# Patient Record
Sex: Female | Born: 1995
Health system: Southern US, Community
[De-identification: ages and names within clinical notes are randomized; demographics above are authoritative.]

## PROBLEM LIST (undated history)

## (undated) DIAGNOSIS — O24419 Gestational diabetes mellitus in pregnancy, unspecified control: Secondary | ICD-10-CM

## (undated) DIAGNOSIS — R0789 Other chest pain: Secondary | ICD-10-CM

## (undated) DIAGNOSIS — E119 Type 2 diabetes mellitus without complications: Secondary | ICD-10-CM

## (undated) DIAGNOSIS — J302 Other seasonal allergic rhinitis: Secondary | ICD-10-CM

## (undated) HISTORY — DX: Other chest pain: R07.89

## (undated) HISTORY — PX: WISDOM TOOTH EXTRACTION: SHX21

## (undated) HISTORY — DX: Other seasonal allergic rhinitis: J30.2

## (undated) HISTORY — PX: OTHER SURGICAL HISTORY: SHX169

---
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
## 2013-03-28 ENCOUNTER — Encounter (HOSPITAL_COMMUNITY): Payer: Self-pay | Admitting: Emergency Medicine

## 2013-03-28 ENCOUNTER — Emergency Department (HOSPITAL_COMMUNITY)
Admission: EM | Admit: 2013-03-28 | Discharge: 2013-03-28 | Disposition: A | Discharge status: Home / Self Care | Payer: Self-pay | Attending: Emergency Medicine | Admitting: Emergency Medicine

## 2013-03-28 DIAGNOSIS — J029 Acute pharyngitis, unspecified: Secondary | ICD-10-CM | POA: Insufficient documentation

## 2013-03-28 DIAGNOSIS — R42 Dizziness and giddiness: Secondary | ICD-10-CM | POA: Insufficient documentation

## 2013-03-28 DIAGNOSIS — Y939 Activity, unspecified: Secondary | ICD-10-CM | POA: Insufficient documentation

## 2013-03-28 DIAGNOSIS — Z23 Encounter for immunization: Secondary | ICD-10-CM | POA: Insufficient documentation

## 2013-03-28 DIAGNOSIS — IMO0002 Reserved for concepts with insufficient information to code with codable children: Secondary | ICD-10-CM | POA: Insufficient documentation

## 2013-03-28 DIAGNOSIS — Y990 Civilian activity done for income or pay: Secondary | ICD-10-CM | POA: Insufficient documentation

## 2013-03-28 DIAGNOSIS — Z77098 Contact with and (suspected) exposure to other hazardous, chiefly nonmedicinal, chemicals: Secondary | ICD-10-CM | POA: Insufficient documentation

## 2013-03-28 DIAGNOSIS — Y9289 Other specified places as the place of occurrence of the external cause: Secondary | ICD-10-CM | POA: Insufficient documentation

## 2013-03-28 DIAGNOSIS — T23229A Burn of second degree of unspecified single finger (nail) except thumb, initial encounter: Secondary | ICD-10-CM | POA: Insufficient documentation

## 2013-03-28 DIAGNOSIS — R238 Other skin changes: Secondary | ICD-10-CM | POA: Insufficient documentation

## 2013-03-28 MED ORDER — TETANUS-DIPHTH-ACELL PERTUSSIS 5-2.5-18.5 LF-MCG/0.5 IM SUSP
0.5000 mL | Freq: Once | INTRAMUSCULAR | Status: AC
  Administered 2013-03-28: 0.5 mL via INTRAMUSCULAR
  Filled 2013-03-28: qty 0.5

## 2013-03-28 NOTE — ED Provider Notes (Signed)
CSN: [id number]     Arrival date & time 03/28/13  1455 History  This chart was scribed for Coral Ceo, PA, working with Flint Melter, MD by Dorothey Baseman, ED Scribe. This patient was seen in room TR04C/TR04C and the patient's care was started at 4:14 PM.    Chief Complaint  Patient presents with  . reaction to chemical    The history is provided by the patient. No language interpreter was used.    HPI Comments: Megan Shannon is a 17 y.o. female who presents to the ED for a chemic reaction.  Around 12:30 at work today a [employer] trap spilled on her hands.  She complains of a burning sensation to her hands bilaterally.  She also has a blister to the right 3rd finger.  She states that she washed her hands multiple times with soap and water after the accident. She may have inhaled some of the fumes and states she felt her throat closing and coughing, which has improved since onset and still mildly present.  No solution splashed into her eyes.  Patient also reports some mild associated dizziness, which has also resolved. She denies nausea, vomiting, and abdominal pain. She denies any knowledge of allergies, allergic reactions in the past, or any other pertinent medical history.    History reviewed. No pertinent past medical history. History reviewed. No pertinent past surgical history. No family history on file. History  Substance Use Topics  . Smoking status: Never Smoker   . Smokeless tobacco: Not on file  . Alcohol Use: No   OB History   Grav Para Term Preterm Abortions TAB SAB Ect Mult Living                 Review of Systems  Constitutional: Negative for chills, activity change, appetite change and fatigue.  HENT: Positive for sore throat. Negative for drooling, facial swelling, rhinorrhea, trouble swallowing and voice change.   Eyes: Negative for pain, itching and visual disturbance.  Respiratory: Negative for cough, choking, chest tightness, shortness of  breath, wheezing and stridor.   Cardiovascular: Negative for chest pain and palpitations.  Gastrointestinal: Negative for nausea, vomiting and abdominal pain.  Musculoskeletal: Negative for arthralgias and joint swelling.  Skin: Positive for color change. Negative for wound.  Neurological: Positive for dizziness. Negative for weakness and headaches.  All other systems reviewed and are negative.    Allergies  Review of patient's allergies indicates not on file.  Home Medications  No current outpatient prescriptions on file.  Triage Vitals: BP 124/80  Pulse 86  Temp(Src) 99.2 F (37.3 C)  Resp 16  SpO2 100%  Filed Vitals:   03/28/13 1523  BP: 124/80  Pulse: 86  Temp: 99.2 F (37.3 C)  Resp: 16  SpO2: 100%     Physical Exam  Nursing note and vitals reviewed. Constitutional: She is oriented to person, place, and time. She appears well-developed and well-nourished. No distress.  HENT:  Head: Normocephalic and atraumatic.  Right Ear: External ear normal.  Left Ear: External ear normal.  Nose: Nose normal.  Mouth/Throat: Oropharynx is clear and moist. No oropharyngeal exudate.  Uvula midline. No perioral or periorbital edema bilaterally.   Eyes: Conjunctivae and EOM are normal. Pupils are equal, round, and reactive to light.  Neck: Normal range of motion. Neck supple. No tracheal deviation present.  No palpable masses or edema to the neck throughout  Cardiovascular: Normal rate, regular rhythm, normal heart sounds and intact distal pulses.  Exam reveals no gallop and no friction rub.   No murmur heard. Radial pulses present bilaterally  Pulmonary/Chest: Effort normal and breath sounds normal. No respiratory distress. She has no wheezes. She has no rales. She exhibits no tenderness.  Abdominal: Soft. Bowel sounds are normal. She exhibits no distension and no mass. There is no tenderness. There is no rebound and no guarding.  Musculoskeletal: Normal range of motion. She  exhibits tenderness. She exhibits no edema.  Grip strength 5/5 bilaterally. No limitations with ROM of the right hand.   Lymphadenopathy:    She has no cervical adenopathy.  Neurological: She is alert and oriented to person, place, and time.  Skin: Skin is warm and dry. She is not diaphoretic.  Blister present on the medial distal 3rd right finger with no open wounds or surrounding erythema.  No erythema to her hands throughout.    Psychiatric: She has a normal mood and affect. Her behavior is normal.    ED Course  Procedures (including critical care time)  DIAGNOSTIC STUDIES: Oxygen Saturation is 100% on room air, normal by my interpretation.    COORDINATION OF CARE: 4:18 PM - Will consult with the attending physician about potential treatment course. Patient verbalizes understanding and agrees with treatment plan.  4:54 PM- Consulted with Dr. Effie Shy and decided that poison control does not need to be contacted. Discussed that the ingredients in the chemical do not pose any serious threat to the patient's health (acetic acid). Advised her to keep the affected areas clean and to follow up if there are any new or worsening symptoms. Will apply a topical antibiotic and a band-aid. Will order a tetanus vaccination. Discussed treatment plan with patient at bedside and patient verbalized agreement.    Labs Review Labs Reviewed - No data to display Imaging Review No results found.  EKG Interpretation   None       MDM   1. Chemical exposure     Megan Shannon is a 17 y.o. female who presents to the ED for a chemic reaction.  Tetanus booster given.    Patient was evaluated in the ED after a chemical spill.  She had no difficulty swallowing or breathing. She has a small blister to her hand with no open wounds.  Patient non-toxic and remained in no acute distress.  Patient was instructed to return to the ED if they experience any difficulty breathing/swallowing or other  concerns.  Patient was in agreement with discharge and plan.     Final impressions: 1. Chemical exposure     Luiz Iron PA-C   This patient was discussed with Dr. Effie Shy   I personally performed the services described in this documentation, which was scribed in my presence. The recorded information has been reviewed and is accurate.    Jillyn Ledger, PA-C 03/30/13 [phone]

## 2013-03-28 NOTE — ED Notes (Signed)
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  She sees redness and she feels  Sob not sob detected.  No rash

## 2013-03-28 NOTE — ED Notes (Signed)
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  Rash noted to right hand since incident.  Pt states "it feels like my throat is closing".  Pt speaking in full sentences- no respiratory distress noted.  Respirations equal and unlabored.

## 2013-04-02 NOTE — ED Provider Notes (Signed)
Medical screening examination/treatment/procedure(s) were performed by non-physician practitioner and as supervising physician I was immediately available for consultation/collaboration.  Flint Melter, MD 04/02/13 [phone]

## 2013-12-29 ENCOUNTER — Emergency Department (HOSPITAL_COMMUNITY)
Admission: EM | Admit: 2013-12-29 | Discharge: 2013-12-29 | Disposition: A | Discharge status: Home / Self Care | Payer: BC Managed Care – PPO | Source: Home / Self Care | Attending: Emergency Medicine | Admitting: Emergency Medicine

## 2013-12-29 ENCOUNTER — Encounter (HOSPITAL_COMMUNITY): Payer: Self-pay | Admitting: Emergency Medicine

## 2013-12-29 DIAGNOSIS — N390 Urinary tract infection, site not specified: Secondary | ICD-10-CM

## 2013-12-29 LAB — POCT URINALYSIS DIP (DEVICE)
Glucose, UA: 100 mg/dL — AB
LEUKOCYTES UA: NEGATIVE
Nitrite: POSITIVE — AB
PROTEIN: 100 mg/dL — AB
Specific Gravity, Urine: 1.025 (ref 1.005–1.030)
UROBILINOGEN UA: 0.2 mg/dL (ref 0.0–1.0)
pH: 5.5 (ref 5.0–8.0)

## 2013-12-29 LAB — POCT PREGNANCY, URINE: PREG TEST UR: NEGATIVE

## 2013-12-29 MED ORDER — ONDANSETRON 4 MG PO TBDP
4.0000 mg | ORAL_TABLET | Freq: Once | ORAL | Status: AC
  Administered 2013-12-29: 4 mg via ORAL

## 2013-12-29 MED ORDER — ONDANSETRON 4 MG PO TBDP
ORAL_TABLET | ORAL | Status: AC
  Filled 2013-12-29: qty 1

## 2013-12-29 MED ORDER — ONDANSETRON HCL 4 MG PO TABS: 4.0000 mg | ORAL_TABLET | Freq: Three times a day (TID) | ORAL | Status: DC | PRN

## 2013-12-29 MED ORDER — ACETAMINOPHEN 325 MG PO TABS
ORAL_TABLET | ORAL | Status: AC
  Filled 2013-12-29: qty 2

## 2013-12-29 MED ORDER — NITROFURANTOIN MONOHYD MACRO 100 MG PO CAPS: 100.0000 mg | ORAL_CAPSULE | Freq: Two times a day (BID) | ORAL | Status: DC

## 2013-12-29 MED ORDER — ACETAMINOPHEN 325 MG PO TABS
650.0000 mg | ORAL_TABLET | Freq: Once | ORAL | Status: AC
  Administered 2013-12-29: 650 mg via ORAL

## 2013-12-29 NOTE — ED Notes (Signed)
Pt  Has  Multiple symptoms  To  Include   Vomiting     Stomach  Pain  Headache     Fatigue      With  Symptoms  Starting  Yesterday       Pt is  Sitting  Upright on  Exam table   Speaking in  Complete  sentances  No  Active  Vomiting

## 2013-12-29 NOTE — Discharge Instructions (Signed)
Your test for strep throat was negative. Your pregnancy test was negative. Your urine studies showed a urinary tract infection. Please begin taking Macrobid as prescribed for this infection and use Zofran as directed for nausea. If symptoms do not improve over the next 1-2 days, please follow up with your doctor.   Urinary Tract Infection Urinary tract infections (UTIs) can develop anywhere along your urinary tract. Your urinary tract is your body's drainage system for removing wastes and extra water. Your urinary tract includes two kidneys, two ureters, a bladder, and a urethra. Your kidneys are a pair of bean-shaped organs. Each kidney is about the size of your fist. They are located below your ribs, one on each side of your spine. CAUSES Infections are caused by microbes, which are microscopic organisms, including fungi, viruses, and bacteria. These organisms are so small that they can only be seen through a microscope. Bacteria are the microbes that most commonly cause UTIs. SYMPTOMS  Symptoms of UTIs may vary by age and gender of the patient and by the location of the infection. Symptoms in young women typically include a frequent and intense urge to urinate and a painful, burning feeling in the bladder or urethra during urination. Older women and men are more likely to be tired, shaky, and weak and have muscle aches and abdominal pain. A fever may mean the infection is in your kidneys. Other symptoms of a kidney infection include pain in your back or sides below the ribs, nausea, and vomiting. DIAGNOSIS To diagnose a UTI, your caregiver will ask you about your symptoms. Your caregiver also will ask to provide a urine sample. The urine sample will be tested for bacteria and white blood cells. White blood cells are made by your body to help fight infection. TREATMENT  Typically, UTIs can be treated with medication. Because most UTIs are caused by a bacterial infection, they usually can be treated with  the use of antibiotics. The choice of antibiotic and length of treatment depend on your symptoms and the type of bacteria causing your infection. HOME CARE INSTRUCTIONS  If you were prescribed antibiotics, take them exactly as your caregiver instructs you. Finish the medication even if you feel better after you have only taken some of the medication.  Drink enough water and fluids to keep your urine clear or pale yellow.  Avoid caffeine, tea, and carbonated beverages. They tend to irritate your bladder.  Empty your bladder often. Avoid holding urine for long periods of time.  Empty your bladder before and after sexual intercourse.  After a bowel movement, women should cleanse from front to back. Use each tissue only once. SEEK MEDICAL CARE IF:   You have back pain.  You develop a fever.  Your symptoms do not begin to resolve within 3 days. SEEK IMMEDIATE MEDICAL CARE IF:   You have severe back pain or lower abdominal pain.  You develop chills.  You have nausea or vomiting.  You have continued burning or discomfort with urination. MAKE SURE YOU:   Understand these instructions.  Will watch your condition.  Will get help right away if you are not doing well or get worse. Document Released: 03/10/2005 Document Revised: 11/30/2011 Document Reviewed: 07/09/2011 [facility] Patient Information 2015 [place], [place]. This information is not intended to replace advice given to you by your health care provider. Make sure you discuss any questions you have with your health care provider.

## 2013-12-29 NOTE — ED Provider Notes (Signed)
CSN: [id number]     Arrival date & time 12/29/13  [id number] History   First MD Initiated Contact with Patient 12/29/13 [phone]     Chief Complaint  Patient presents with  . Emesis   (Consider location/radiation/quality/duration/timing/severity/associated sxs/prior Treatment) HPI Comments: Patient states she began feeling unwell yesterday evening. Developed sore throat, nausea, vomiting without diarrhea or fever.  Works at [employer] No known ill contacts No fever Otherwise healthy [facility]   Patient is a 18 y.o. female presenting with vomiting. The history is provided by the patient.  Emesis Severity:  Moderate Duration:  12 hours Timing:  Intermittent Quality:  Stomach contents Associated symptoms: sore throat   Associated symptoms: no diarrhea     History reviewed. No pertinent past medical history. History reviewed. No pertinent past surgical history. History reviewed. No pertinent family history. History  Substance Use Topics  . Smoking status: Never Smoker   . Smokeless tobacco: Not on file  . Alcohol Use: No   OB History   Grav Para Term Preterm Abortions TAB SAB Ect Mult Living                 Review of Systems  Constitutional: Positive for appetite change.  HENT: Positive for sore throat. Negative for congestion, dental problem, ear pain, mouth sores, postnasal drip, rhinorrhea, trouble swallowing and voice change.   Eyes: Negative.   Respiratory: Negative.   Cardiovascular: Negative.   Gastrointestinal: Positive for nausea and vomiting. Negative for diarrhea.  Endocrine: Negative for polydipsia, polyphagia and polyuria.  Genitourinary: Negative.        Reports that her urine this morning was dark  Musculoskeletal: Negative.   Skin: Negative.   Allergic/Immunologic: Negative for immunocompromised state.  Neurological: Negative.     Allergies  Review of patient's allergies indicates no known allergies.  Home Medications   Prior to  Admission medications   Medication Sig Start Date End Date Taking? Authorizing Provider  levonorgestrel-ethinyl estradiol (AVIANE,ALESSE,LESSINA) 0.1-20 MG-MCG tablet Take 1 tablet by mouth daily.    Historical Provider, MD  nitrofurantoin, macrocrystal-monohydrate, (MACROBID) 100 MG capsule Take 1 capsule (100 mg total) by mouth 2 (two) times daily. X 7 days 12/29/13   Jess BartersJennifer Lee Presson, PA  ondansetron (ZOFRAN) 4 MG tablet Take 1 tablet (4 mg total) by mouth every 8 (eight) hours as needed for nausea or vomiting. 12/29/13   Ardis RowanJennifer Lee Presson, PA  PRESCRIPTION MEDICATION Take 1 tablet by mouth at bedtime. Acne medication.    Historical Provider, MD  PRESCRIPTION MEDICATION Apply 1 application topically at bedtime. Acne medication (gel form)  Apply to face only.    Historical Provider, MD   BP 129/78  Pulse 87  Temp(Src) 98.1 F (36.7 C) (Oral)  Resp 20  SpO2 98%  LMP 12/16/2013 Physical Exam  Nursing note and vitals reviewed. Constitutional: She is oriented to person, place, and time. She appears well-developed and well-nourished. No distress.  HENT:  Head: Normocephalic and atraumatic.  Right Ear: Hearing, tympanic membrane, external ear and ear canal normal.  Left Ear: Hearing, tympanic membrane, external ear and ear canal normal.  Nose: Nose normal.  Mouth/Throat: Uvula is midline and mucous membranes are normal. No oral lesions. No trismus in the jaw. No uvula swelling or dental caries. Posterior oropharyngeal erythema present. No oropharyngeal exudate, posterior oropharyngeal edema or tonsillar abscesses.  Eyes: Conjunctivae are normal. No scleral icterus.  Neck: Normal range of motion. Neck supple.  Cardiovascular: Normal rate, regular rhythm and normal  heart sounds.   Pulmonary/Chest: Effort normal and breath sounds normal. No respiratory distress. She has no wheezes.  Abdominal: Soft. Normal appearance and bowel sounds are normal. She exhibits no distension and no mass. There  is no hepatosplenomegaly. There is tenderness in the periumbilical area. There is no rigidity, no rebound, no guarding, no CVA tenderness, no tenderness at McBurney's point and negative Murphy's sign.  +mild periumbilical discomfort  Musculoskeletal: Normal range of motion.  Lymphadenopathy:    She has no cervical adenopathy.  Neurological: She is alert and oriented to person, place, and time.  Skin: Skin is warm and dry. No rash noted. No erythema.  Psychiatric: She has a normal mood and affect. Her behavior is normal.    ED Course  Procedures (including critical care time) Labs Review Labs Reviewed  POCT URINALYSIS DIP (DEVICE) - Abnormal; Notable for the following:    Glucose, UA 100 (*)    Bilirubin Urine SMALL (*)    Ketones, ur TRACE (*)    Hgb urine dipstick TRACE (*)    Protein, ur 100 (*)    Nitrite POSITIVE (*)    All other components within normal limits  URINE CULTURE  POCT PREGNANCY, URINE    Imaging Review No results found.   MDM   1. UTI (lower urinary tract infection)    UA consistent with UTI. UPT negative. Rapid strep negative. Given 4mg  ODT Zofran at [facility] and patient reports improvement of nausea. Will treat at home with Macrobid and Zofran and advise follow up with PCP if no improvement.     Jess Barters [place], [place] 12/29/13 1058

## 2013-12-30 LAB — URINE CULTURE: Colony Count: 35000

## 2013-12-30 NOTE — ED Provider Notes (Signed)
Medical screening examination/treatment/procedure(s) were performed by non-physician practitioner and as supervising physician I was immediately available for consultation/collaboration.  Leslee Homeavid Merdis Snodgrass, M.D.  Reuben Likesavid C Emrik Erhard, MD 12/30/13 [phone]

## 2013-12-31 LAB — CULTURE, GROUP A STREP

## 2014-01-01 ENCOUNTER — Telehealth (HOSPITAL_COMMUNITY): Payer: Self-pay | Admitting: *Deleted

## 2014-01-01 MED ORDER — AMOXICILLIN 500 MG PO CAPS: 500.0000 mg | ORAL_CAPSULE | Freq: Three times a day (TID) | ORAL | Status: DC

## 2014-01-01 NOTE — ED Notes (Addendum)
Throat culture: Strep beta hemolytic not group A.  Lab shown to Dr. Artis FlockKindl.  [facility]  I called and Momsaid pt. is at work.  Mom notified of diagnosis and Rx. I told Mom where to pick up the Rx. If anyone else in the family gets the same symptoms needs to get checked for strep.  If she is not better after the medication, she needs to get rechecked.  Mom said she understands. Vassie MoselleYork, [name] 01/01/2014 Urine culture: Multiple bacterial types none predominant. Pt. called back and said her Mom gave her the message that we had called and wanted her to call back to make sure she understood everything.    01/01/2014

## 2015-04-25 ENCOUNTER — Encounter (HOSPITAL_COMMUNITY): Payer: Self-pay | Admitting: Family Medicine

## 2015-04-25 ENCOUNTER — Emergency Department (HOSPITAL_COMMUNITY)
Admission: EM | Admit: 2015-04-25 | Discharge: 2015-04-25 | Disposition: A | Discharge status: Home / Self Care | Payer: 59 | Attending: Emergency Medicine | Admitting: Emergency Medicine

## 2015-04-25 ENCOUNTER — Emergency Department (HOSPITAL_COMMUNITY): Payer: 59

## 2015-04-25 DIAGNOSIS — R1110 Vomiting, unspecified: Secondary | ICD-10-CM

## 2015-04-25 DIAGNOSIS — O2391 Unspecified genitourinary tract infection in pregnancy, first trimester: Secondary | ICD-10-CM | POA: Diagnosis not present

## 2015-04-25 DIAGNOSIS — Z793 Long term (current) use of hormonal contraceptives: Secondary | ICD-10-CM | POA: Insufficient documentation

## 2015-04-25 DIAGNOSIS — Z79899 Other long term (current) drug therapy: Secondary | ICD-10-CM | POA: Diagnosis not present

## 2015-04-25 DIAGNOSIS — O26899 Other specified pregnancy related conditions, unspecified trimester: Secondary | ICD-10-CM

## 2015-04-25 DIAGNOSIS — N390 Urinary tract infection, site not specified: Secondary | ICD-10-CM

## 2015-04-25 DIAGNOSIS — R109 Unspecified abdominal pain: Secondary | ICD-10-CM

## 2015-04-25 DIAGNOSIS — Z3490 Encounter for supervision of normal pregnancy, unspecified, unspecified trimester: Secondary | ICD-10-CM

## 2015-04-25 DIAGNOSIS — O210 Mild hyperemesis gravidarum: Secondary | ICD-10-CM | POA: Diagnosis present

## 2015-04-25 LAB — URINALYSIS, ROUTINE W REFLEX MICROSCOPIC
BILIRUBIN URINE: NEGATIVE
Glucose, UA: NEGATIVE mg/dL
HGB URINE DIPSTICK: NEGATIVE
KETONES UR: 15 mg/dL — AB
NITRITE: NEGATIVE
Protein, ur: NEGATIVE mg/dL
Specific Gravity, Urine: 1.022 (ref 1.005–1.030)
UROBILINOGEN UA: 0.2 mg/dL (ref 0.0–1.0)
pH: 7 (ref 5.0–8.0)

## 2015-04-25 LAB — CBC WITH DIFFERENTIAL/PLATELET
BASOS PCT: 0 %
Basophils Absolute: 0 10*3/uL (ref 0.0–0.1)
Eosinophils Absolute: 0 10*3/uL (ref 0.0–0.7)
Eosinophils Relative: 1 %
HEMATOCRIT: 39.6 % (ref 36.0–46.0)
Hemoglobin: 13.4 g/dL (ref 12.0–15.0)
Lymphocytes Relative: 21 %
Lymphs Abs: 1.5 10*3/uL (ref 0.7–4.0)
MCH: 29.5 pg (ref 26.0–34.0)
MCHC: 33.8 g/dL (ref 30.0–36.0)
MCV: 87.2 fL (ref 78.0–100.0)
MONO ABS: 0.4 10*3/uL (ref 0.1–1.0)
MONOS PCT: 5 %
NEUTROS ABS: 5.2 10*3/uL (ref 1.7–7.7)
Neutrophils Relative %: 73 %
Platelets: 142 10*3/uL — ABNORMAL LOW (ref 150–400)
RBC: 4.54 MIL/uL (ref 3.87–5.11)
RDW: 12.8 % (ref 11.5–15.5)
WBC: 7.2 10*3/uL (ref 4.0–10.5)

## 2015-04-25 LAB — COMPREHENSIVE METABOLIC PANEL
ALK PHOS: 51 U/L (ref 38–126)
ALT: 10 U/L — ABNORMAL LOW (ref 14–54)
ANION GAP: 12 (ref 5–15)
AST: 13 U/L — ABNORMAL LOW (ref 15–41)
Albumin: 3.9 g/dL (ref 3.5–5.0)
BILIRUBIN TOTAL: 0.5 mg/dL (ref 0.3–1.2)
BUN: <5 mg/dL — ABNORMAL LOW (ref 6–20)
CALCIUM: 9.4 mg/dL (ref 8.9–10.3)
CO2: 23 mmol/L (ref 22–32)
Chloride: 103 mmol/L (ref 101–111)
Creatinine, Ser: 0.56 mg/dL (ref 0.44–1.00)
Glucose, Bld: 100 mg/dL — ABNORMAL HIGH (ref 65–99)
Potassium: 4.3 mmol/L (ref 3.5–5.1)
SODIUM: 138 mmol/L (ref 135–145)
TOTAL PROTEIN: 6.8 g/dL (ref 6.5–8.1)

## 2015-04-25 LAB — URINE MICROSCOPIC-ADD ON

## 2015-04-25 LAB — PREGNANCY, URINE: PREG TEST UR: POSITIVE — AB

## 2015-04-25 LAB — LIPASE, BLOOD: Lipase: 23 U/L (ref 11–51)

## 2015-04-25 MED ORDER — SODIUM CHLORIDE 0.9 % IV BOLUS (SEPSIS)
1000.0000 mL | Freq: Once | INTRAVENOUS | Status: AC
  Administered 2015-04-25: 1000 mL via INTRAVENOUS

## 2015-04-25 MED ORDER — ONDANSETRON HCL 4 MG/2ML IJ SOLN
4.0000 mg | Freq: Once | INTRAMUSCULAR | Status: AC
  Administered 2015-04-25: 4 mg via INTRAVENOUS
  Filled 2015-04-25: qty 2

## 2015-04-25 MED ORDER — NITROFURANTOIN MONOHYD MACRO 100 MG PO CAPS: 100.0000 mg | ORAL_CAPSULE | Freq: Two times a day (BID) | ORAL | Status: DC

## 2015-04-25 NOTE — Discharge Instructions (Signed)
Take your medications as prescribed. Follow-up with your OB at your scheduled appointment next week. Please return to the Emergency Department if symptoms worsen or new onset of fever, abdominal pain, vomiting blood, blood in urine or stool, difficulty breathing, chest pain.    Emergency Department Resource Guide 1) Find a Doctor and Pay Out of Pocket Although you won't have to find out who is covered by your insurance plan, it is a good idea to ask around and get recommendations. You will then need to call the office and see if the doctor you have chosen will accept you as a new patient and what types of options they offer for patients who are self-pay. Some doctors offer discounts or will set up payment plans for their patients who do not have insurance, but you will need to ask so you aren't surprised when you get to your appointment.  2) Contact Your Local Health Department Not all health departments have doctors that can see patients for sick visits, but many do, so it is worth a call to see if yours does. If you don't know where your local health department is, you can check in your phone book. The CDC also has a tool to help you locate your state's health department, and many state websites also have listings of all of their local health departments.  3) Find a Walk-in Clinic If your illness is not likely to be very severe or complicated, you may want to try a walk in clinic. These are popping up all over the country in pharmacies, drugstores, and shopping centers. They're usually staffed by nurse practitioners or physician assistants that have been trained to treat common illnesses and complaints. They're usually fairly quick and inexpensive. However, if you have serious medical issues or chronic medical problems, these are probably not your best option.  No Primary Care Doctor: - Call Health Connect at  [phone] - they can help you locate a primary care doctor that  accepts your insurance,  provides certain services, etc. - Physician Referral Service- [phone]  Chronic Pain Problems: Organization         Address  Phone   Notes  [facility]  [phone] Patients need to be referred by their primary care doctor.   Medication Assistance: Organization         Address  Phone   Notes  [facility] Medication [facility] [address] [place], [place] [address] [phone] --Must be a resident of [facility] -- Must have NO insurance coverage whatsoever (no Medicaid/ Medicare, etc.) -- The pt. MUST have a primary care doctor that directs their care regularly and follows them in the community   [facility]ist  [phone]   [employer]  [phone]    Agencies that provide inexpensive medical care: Organization         Address  Phone   Notes  [facility]  [phone]   [facility]    [phone]   [facility] [address] [place], [place] [address] [phone]   [facility] [address] [place]. [address], [place] [phone]   Planned Parenthood    [phone]   [facility]    [phone]   [facility]  [address], [place] Phone:  [phone], Fax:  [phone] Hours of Operation:  9 am - 6 pm, M-F.  Also  accepts Medicaid/Medicare and self-pay.  [facility] [place] [address], [address], [place] Phone: [phone], Fax: [phone]. Hours of Operation:  8:30 am - 5:30 pm, M-F.  Also accepts Medicaid and self-pay.  [facility] [place] [address], [place] Phone: [phone]   [place], [place], [place] [phone], Ext. [phone] Mondays & Thursdays: 7-9 AM.  First 15 patients are seen on a first come, first serve basis.    [place] Providers:  Organization         Address  Phone    Notes  [facility] [address], [place] [phone] Also accepts self-pay patients.  [facility] [id number] [place], [place]  [phone]   [place], [address], [place] [phone]   [facility] [address], [place] [phone]   Lucianne Lei [address], [place]   [phone] Only accepts [place] Access [place] patients after they have their name applied to their card.   Self-Pay (no insurance) in [facility]:  Organization         Address  Phone   Notes  Sickle Cell Patients, [facility] [place] [phone]   [facility] [place] [phone]   [facility] [place]  [place], [place], [place] [phone]   [facility]/Dr. Osei-Bonsu  [address], [place] or [address], [place] [phone] Phone number for both [place] and [place] locations is the same.  [facility] [address] [place] [phone]   [facility] [address], [place] or [address] [phone]   [facility] [address] [place] [phone], phone; [phone], fax Sees patients 1st and 3rd Saturday of every month.  Must not qualify for public or private insurance (i.e. Medicaid, Medicare, [place] Health Choice, Veterans' Benefits)  Household income should be no more than 200% of the poverty level The clinic cannot treat you if you are pregnant or think you are pregnant  Sexually transmitted diseases are not treated at the clinic.    Dental Care: Organization         Address  Phone  Notes  [facility] [place] [phone] Accepts children up to age 65 who are enrolled in [place] or [place]; pregnant women with a Medicaid card; and children who have applied for Medicaid or [place] Health Choice, but were declined, whose parents can pay a reduced fee at time of service.  [facility] of [facility]  [address], [place] [phone] Accepts children up to age 36 who are enrolled in [place] or [place]; pregnant women with a Medicaid card; and children who have applied for Medicaid or [place] Health Choice, but were declined, whose parents can pay a reduced fee at time of service.  [facility]  [place] [phone] Patients are seen by appointment only. Walk-ins are not accepted. [place] will see patients 61 years of age and older. Monday - Tuesday (8am-5pm)  Most Wednesdays (8:30-5pm) $30 per visit, cash only  [facility] Adult [employer] PROGRAM  [address], [facility] [phone] Patients are seen by appointment only. Walk-ins are not accepted. [place] will see patients 29 years of age and older. One Wednesday Evening (Monthly: Volunteer Based).  $30 per visit, cash only  [place]  [phone] for adults; Children under age 60, call Graduate Pediatric Dentistry at [phone]. Children aged 57-14, please call [phone] to request a pediatric application.  Dental services are provided in all areas of dental care including fillings, crowns and bridges, complete and partial dentures, implants, gum treatment, root canals, and extractions. Preventive care is also provided. Treatment is provided to both adults and children. Patients are selected via a lottery and there is often a waiting list.   [facility] [address], [place]  [phone] [email or web address]   [facility] [address] [place], [place] [phone], Ext.  123 Second and Fourth Thursday of each month, opens at 6:30 AM; Clinic ends at 9 AM.  Patients are seen on a first-come first-served basis, and a limited number are seen during each clinic.   [facility]  [address] Hillard Danker [place], [place] [phone]   Eligibility Requirements You must have lived in [place], [place], or [place] counties for at least the last three months.   You cannot be eligible for state or federal sponsored [employer], including [employer], [place], or [employer].   You generally cannot be eligible for healthcare insurance through your employer.    How to apply: Eligibility screenings are held every Tuesday and Wednesday afternoon from 1:00 pm until 4:00 pm. You do not need an appointment for the interview!  [facility] [address] [place], [place]   [place]  [place] Department  [place]  [phone]    Behavioral Health Resources in the Community: Intensive Outpatient Programs Organization         Address  Phone  Notes  [facility] [place]. [address], [place], [place] [phone]   [facility] [address] [place], [place]   ADS: Alcohol [facility] Drug S[facility] [address], [place], [place]   [place] [address]  [place], [place] or [phone]   Substance Abuse Resources Organization         Address  Phone  Notes  Alcohol and Drug Services  [phone]   [place]  [phone]   The [place]   Chinita Pester  [phone]   Residential & Outpatient Substance Abuse Program  [phone]   Psychological Services Organization         Address  Phone  Notes  [facility] [place]  [place]  [phone]   [place] [address] [place] or [phone]    Mobile Crisis Teams Organization         Address  Phone  Notes  [facility], Mobile Crisis Care Unit  [phone]   [facility]  [address] [place], [place]   [place] Levels [address] [place] [phone]    Self-Help/Support Groups Organization         Address  Phone  Notes  [facility] [place] - variety of support groups  336- [phone] Call for more information  Narcotics Anonymous (NA), [facility] [address], [place]  2 meetings at this location   Statisticianesidential Treatment Programs Organization         Address  Phone  Notes  [facility] [address],    [place]  [phone]   [facility]  [address], [place] [phone], [place], [place] [phone]   [facility] [address], [place] [phone] Admissions: 8am-3pm M-F  [facility] [address]    [place], [place] [phone]   [facility] [address], [place], [place] [phone]   [facility] [address]  [place], [place] [phone]   [facility] - Intensive Outpatient [address], [place], [place] [phone]   [facility] ([facility] [address]  [place], [place] [phone] or [phone]   [facility] (RTS) [address] [place], [place] [phone] Accepts Medicaid  [facility] [address]  [place] [phone] Substance Abuse/Addiction Treatment   [facility] Behavioral Health Resources Organization         Address  Phone  Notes  [facility]  [phone]) [phone]   Angie FavaJulie Brannon, PhD [address], [address] [place], [place]   [phone] or [phone](336) [phone]   [facility]   [address] [place], [place] [phone]   [facility] [address], [place], [place] [phone]) [phone] Insurance/Medicaid/sponsorship through [facility]  [facility] [address]                                    [place], [place] [phone](336) [phone] Therapy/tele-psych/case  [facility] [address].   [place], [place] [phone]) [phone]    Dr. Lolly MustacheArfeen  [phone](336) [phone]   [facility]  [employer] [facility] 1) [address], [place] 2) [address], [place] 3)  [address], [place] [phone](336) [phone]) [phone]  [phone]) [phone]   [facility] Child Abuse Hotline [phone]) [phone] or [phone](336) [phone] (After Hours)

## 2015-04-25 NOTE — ED Provider Notes (Signed)
CSN: [id number]     Arrival date & time 04/25/15  0730 History   First MD Initiated Contact with Patient 04/25/15 [phone]     Chief Complaint  Patient presents with  . Morning Sickness  . Abdominal Pain     (Consider location/radiation/quality/duration/timing/severity/associated sxs/prior Treatment) HPI Comments: Patient is a 19 year old female G1P0 who presents to the ED with complaint of nausea vomiting, onset Sunday night. Patient reports having constant nausea with multiple episodes of NBNB vomiting daily. She notes her nausea is worse in the morning. She also endorses having constant aching epigastric pain, no aggravating or alleviating factors. Endorses urinary frequency. She notes she was seen by her PCP on Wednesday, diagnosed with virus and given rx for Bentyl. Pt states she took 1 dose of bentyl before she was notified that her pregnancy test was positive at that visit was positive. Patient has an appointment scheduled with [facility] on [facility]14. She notes she has been taking Tums and Pepcid with mild relief. Denies fever, chills, sore throat, SOB, CP, diarrhea, constipation, hematuria, dysuria, vaginal bleeding, vaginal discharge. LMP mid-September. Denies any prior abdominal surgeries.    History reviewed. No pertinent past medical history. History reviewed. No pertinent past surgical history. No family history on file. Social History  Substance Use Topics  . Smoking status: Never Smoker   . Smokeless tobacco: None  . Alcohol Use: No   OB History    No data available     Review of Systems  Gastrointestinal: Positive for nausea, vomiting and abdominal pain.  Genitourinary: Positive for frequency.  All other systems reviewed and are negative.     Allergies  Review of patient's allergies indicates no known allergies.  Home Medications   Prior to Admission medications   Medication Sig Start Date End Date Taking? Authorizing Provider  calcium carbonate (TUMS - DOSED IN MG  ELEMENTAL CALCIUM) 500 MG chewable tablet Chew 1 tablet by mouth once.    Yes Historical Provider, MD  famotidine (PEPCID) 20 MG tablet Take 20 mg by mouth once.   Yes Historical Provider, MD  levonorgestrel-ethinyl estradiol (AVIANE,ALESSE,LESSINA) 0.1-20 MG-MCG tablet Take 1 tablet by mouth daily.    Historical Provider, MD  nitrofurantoin, macrocrystal-monohydrate, (MACROBID) 100 MG capsule Take 1 capsule (100 mg total) by mouth 2 (two) times daily. 04/25/15   Satira Sark Nadeau, PA-C   BP 105/53 mmHg  Pulse 77  Temp(Src) 98.4 F (36.9 C) (Oral)  Resp 16  Ht  (1.676 m)  Wt 171 lb (77.565 kg)  BMI 27.61 kg/m2  SpO2 100%  LMP 02/27/2015 (Approximate) Physical Exam  Constitutional: She is oriented to person, place, and time. She appears well-developed and well-nourished. No distress.  HENT:  Head: Normocephalic and atraumatic.  Mouth/Throat: Uvula is midline, oropharynx is clear and moist and mucous membranes are normal. No oropharyngeal exudate.  Eyes: Conjunctivae and EOM are normal. Pupils are equal, round, and reactive to light. Right eye exhibits no discharge. Left eye exhibits no discharge. No scleral icterus.  Neck: Normal range of motion. Neck supple.  Cardiovascular: Normal rate, regular rhythm, normal heart sounds and intact distal pulses.   Pulmonary/Chest: Effort normal and breath sounds normal. No respiratory distress. She has no wheezes. She has no rales. She exhibits no tenderness.  Abdominal: Soft. Bowel sounds are normal. She exhibits no distension and no mass. There is tenderness in the epigastric area. There is no rigidity, no rebound, no guarding, no tenderness at McBurney's point and negative Murphy's sign.  Musculoskeletal:  She exhibits no edema.  Lymphadenopathy:    She has no cervical adenopathy.  Neurological: She is alert and oriented to person, place, and time.  Skin: Skin is warm and dry. She is not diaphoretic.  Nursing note and vitals  reviewed.   ED Course  Procedures (including critical care time) Labs Review Labs Reviewed  CBC WITH DIFFERENTIAL/PLATELET - Abnormal; Notable for the following:    Platelets 142 (*)    All other components within normal limits  COMPREHENSIVE METABOLIC PANEL - Abnormal; Notable for the following:    Glucose, Bld 100 (*)    BUN <5 (*)    AST 13 (*)    ALT 10 (*)    All other components within normal limits  URINALYSIS, ROUTINE W REFLEX MICROSCOPIC (NOT AT [facility]) - Abnormal; Notable for the following:    APPearance CLOUDY (*)    Ketones, ur 15 (*)    Leukocytes, UA SMALL (*)    All other components within normal limits  PREGNANCY, URINE - Abnormal; Notable for the following:    Preg Test, Ur POSITIVE (*)    All other components within normal limits  URINE MICROSCOPIC-ADD ON - Abnormal; Notable for the following:    Squamous Epithelial / LPF FEW (*)    Bacteria, UA MANY (*)    All other components within normal limits  LIPASE, BLOOD    Imaging Review Koreas Ob Comp Less 14 Wks  04/25/2015  CLINICAL DATA:  Abdominal pain since Sunday, hyperemesis, pregnant EXAM: OBSTETRIC <14 WK US AND TRANSVAGINAL OB US TECHNIQUE: Both transabdominal and transvaginal ultrasound examinations were performed for complete evaluation of the gestation as well as the maternal uterus, adnexal regions, and pelvic cul-de-sac. Transvaginal technique was performed to assess early pregnancy. COMPARISON:  None FINDINGS: Intrauterine gestational sac: Visualized/normal in shape. Yolk sac:  Present Embryo:  Present Cardiac Activity: Present Heart Rate: 156  bpm CRL:  5.3  mm   6 w   2 d                  US EDC: 12/17/2015 Maternal uterus/adnexae: No subchorionic hemorrhage. RIGHT ovary normal size and morphology 2.4 x 3.0 x 2.3 cm. LEFT ovary normal size and morphology, 1.9 x 2.9 x 2.2 cm. No adnexal masses or free pelvic fluid. IMPRESSION: Single live intrauterine gestation measured at 6 weeks 2 days EGA by crown-rump  length. No acute abnormalities. Electronically Signed   By: Ulyses SouthwardMark  Boles M.D.   On: 04/25/2015 09:54   [place]oreas Ob Transvaginal  04/25/2015  CLINICAL DATA:  Abdominal pain since Sunday, hyperemesis, pregnant EXAM: OBSTETRIC <14 WK US AND TRANSVAGINAL OB US TECHNIQUE: Both transabdominal and transvaginal ultrasound examinations were performed for complete evaluation of the gestation as well as the maternal uterus, adnexal regions, and pelvic cul-de-sac. Transvaginal technique was performed to assess early pregnancy. COMPARISON:  None FINDINGS: Intrauterine gestational sac: Visualized/normal in shape. Yolk sac:  Present Embryo:  Present Cardiac Activity: Present Heart Rate: 156  bpm CRL:  5.3  mm   6 w   2 d                  US EDC: 12/17/2015 Maternal uterus/adnexae: No subchorionic hemorrhage. RIGHT ovary normal size and morphology 2.4 x 3.0 x 2.3 cm. LEFT ovary normal size and morphology, 1.9 x 2.9 x 2.2 cm. No adnexal masses or free pelvic fluid. IMPRESSION: Single live intrauterine gestation measured at 6 weeks 2 days EGA by crown-rump length. No acute abnormalities.  Electronically Signed   By: Ulyses Southward M.D.   On: 04/25/2015 09:54   I have personally reviewed and evaluated these images and lab results as part of my medical decision-making.  Filed Vitals:   04/25/15 0945  BP: 105/53  Pulse: 77  Temp:   Resp: 16     MDM   Final diagnoses:  Pregnant  UTI (lower urinary tract infection)    Patient presents with nausea, vomiting, epigastric pain and urinary frequency. Patient had a positive pregnancy test 3 days ago at PCP. She has been taking Tums and Pepcid with mild relief. VSS. Exam revealed mild epigastric tenderness, no peritoneal signs. Patient given IV fluids and Zofran. UA revealed UTI. Labs unremarkable. Pregnancy positive. OB US revealed single IUP at 6 weeks and 2 days. She reports her pain and nausea have improved. Patient able to tolerate by mouth. Plan to d/c pt home with antibiotics  for UTI. Advised pt to follow up with OB at her scheduled appointment on 11/14.   Evaluation does not show pathology requring ongoing emergent intervention or admission. Pt is hemodynamically stable and mentating appropriately. Discussed findings/results and plan with patient/guardian, who agrees with plan. All questions answered. Return precautions discussed and outpatient follow up given.      Satira Sark [place], [place] 04/25/15 1012  Richardean Canal, MD 04/26/15 0800

## 2015-04-25 NOTE — ED Notes (Signed)
Pt presents from home via POV with c/o nausea/vomiting since Saturday accompanied by abdominal aching and cramping.  NV is worse in the morning.  Reports was seen at her PCP Wednesday and had POSTIVE pregnancy test.  She is in NAD at this time.

## 2015-04-25 NOTE — ED Notes (Signed)
Pt comfortable with discharge and follow up instructions. Pt declines wheelchair, escorted to waiting area by this RN. Prescriptions x1. 

## 2015-04-30 LAB — OB RESULTS CONSOLE HEPATITIS B SURFACE ANTIGEN: Hepatitis B Surface Ag: NEGATIVE

## 2015-04-30 LAB — OB RESULTS CONSOLE HIV ANTIBODY (ROUTINE TESTING): HIV: NONREACTIVE

## 2015-04-30 LAB — OB RESULTS CONSOLE RPR: RPR: NONREACTIVE

## 2015-04-30 LAB — OB RESULTS CONSOLE ABO/RH: RH Type: POSITIVE

## 2015-04-30 LAB — OB RESULTS CONSOLE RUBELLA ANTIBODY, IGM: Rubella: IMMUNE

## 2015-04-30 LAB — OB RESULTS CONSOLE ANTIBODY SCREEN: ANTIBODY SCREEN: NEGATIVE

## 2015-05-30 ENCOUNTER — Other Ambulatory Visit: Payer: Self-pay | Admitting: Obstetrics and Gynecology

## 2015-05-30 LAB — OB RESULTS CONSOLE GC/CHLAMYDIA
Chlamydia: NEGATIVE
GC PROBE AMP, GENITAL: NEGATIVE

## 2015-06-03 LAB — CYTOLOGY - PAP

## 2015-06-15 NOTE — L&D Delivery Note (Signed)
Delivery Note At 11:00 PM a viable female was delivered via Vaginal, Spontaneous Delivery (Presentation: Left Occiput Anterior).  APGAR: 6, 9; weight 10 lb 3.3 oz (4630 g).   Placenta status: Intact, Spontaneous.  Cord: 3 vessels with the following complications: None.    Anesthesia: Epidural  Episiotomy: None Lacerations: 2nd degree bilateral sulcus tear;  Suture Repair: 3.0 vicryl Est. Blood Loss (mL): 780 800 mcg Cytotec per Rectum  Mom to postpartum.  Baby to Couplet care / Skin to Skin. Parents desire circumcision Elmore GuiseLori A Clemmons 12/27/2015, 1:17 AM

## 2015-06-20 MED FILL — ONDANSETRON ODT 4 MG TABLET: 4 | 10 days supply | Qty: 30 | Fill #1

## 2015-07-10 MED FILL — ONDANSETRON ODT 8 MG TABLET: 8 | 7 days supply | Qty: 21 | Fill #1

## 2015-07-11 ENCOUNTER — Encounter: Payer: Self-pay | Admitting: Cardiology

## 2015-07-18 ENCOUNTER — Encounter: Payer: Self-pay | Admitting: Cardiovascular Disease

## 2015-07-18 ENCOUNTER — Ambulatory Visit (INDEPENDENT_AMBULATORY_CARE_PROVIDER_SITE_OTHER): Payer: BLUE CROSS/BLUE SHIELD | Admitting: Cardiovascular Disease

## 2015-07-18 VITALS — BP 106/58 | HR 72 | Ht 66.0 in | Wt 168.9 lb

## 2015-07-18 DIAGNOSIS — R0789 Other chest pain: Secondary | ICD-10-CM | POA: Insufficient documentation

## 2015-07-18 DIAGNOSIS — R002 Palpitations: Secondary | ICD-10-CM

## 2015-07-18 NOTE — Patient Instructions (Signed)
Medication Instructions:  Your physician recommends that you continue on your current medications as directed. Please refer to the Current Medication list given to you today.   Labwork: none  Testing/Procedures: none  Follow-Up: Follow up with Dr. Berry as needed.   Any Other Special Instructions Will Be Listed Below (If Applicable).     If you need a refill on your cardiac medications before your next appointment, please call your pharmacy.   

## 2015-07-18 NOTE — Progress Notes (Signed)
     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 She was referred by her OB/GYN Dr. Dion Body  for cardiovascular evaluation because of atypical chest pain. She has no cardiac risk factors. She does not smoke or drink. Her father did die of sudden cardiac death and apparently an autopsy enlarged heart was found. She gets occasional atypical chest pain which she's had before her pregnancy as well. These are fleeting and not related to activity. I reassured her that it's unlikely that these are cardiovascular in nature and have not recommended any further workup at this time.   Current Outpatient Prescriptions  Medication Sig Dispense Refill  . ondansetron (ZOFRAN) 4 MG tablet Take 4 mg by mouth every 8 (eight) hours as needed for nausea or vomiting.    . prenatal vitamin w/FE, FA (NATACHEW) 29-1 MG CHEW chewable tablet Chew 1 tablet by mouth daily at 12 noon.     No current facility-administered medications for this visit.    No Known Allergies  Social History   Social History  . Marital Status: Single    Spouse Name: N/A  . Number of Children: N/A  . Years of Education: N/A   Occupational History  . Not on file.   Social History Main Topics  . Smoking status: Never Smoker   . Smokeless tobacco: Not on file  . Alcohol Use: No  . Drug Use: Not on file  . Sexual Activity: Not on file   Other Topics Concern  . Not on file   Social History Narrative     Review of Systems: General: negative for chills, fever, night sweats or weight changes.  Cardiovascular: negative for chest pain, dyspnea on exertion, edema, orthopnea, palpitations, paroxysmal nocturnal dyspnea or shortness of breath Dermatological: negative for rash Respiratory:  negative for cough or wheezing Urologic: negative for hematuria Abdominal: negative for nausea, vomiting, diarrhea, bright red blood per rectum, melena, or hematemesis Neurologic: negative for visual changes, syncope, or dizziness All other systems reviewed and are otherwise negative except as noted above.    Blood pressure 106/58, pulse 72, height  (1.676 m), weight 168 lb 14.4 oz (76.613 kg), last menstrual period 02/27/2015.  General appearance: alert and no distress Neck: no adenopathy, no carotid bruit, no JVD, supple, symmetrical, trachea midline and thyroid not enlarged, symmetric, no tenderness/mass/nodules Lungs: clear to auscultation bilaterally Heart: regular rate and rhythm, S1, S2 normal, no murmur, click, rub or gallop Extremities: extremities normal, atraumatic, no cyanosis or edema  EKG normal sinus rhythm at 70 without ST or T-wave changes. 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 She has no cardiac risk factors. She occasionally gets fleeting atypical chest pain. Her father did die of sudden cardiac death and apparently the autopsy showed that he had "enlarged heart". The patient has no carotid risk factors. She has occasional fleeting atypical chest pain which does not sound cardiovascular nature. I do not think further workup is required at this time.      Runell Gess MD FACP,FACC,FAHA, [facility] 07/18/2015 9:39 AM

## 2015-07-18 NOTE — Assessment & Plan Note (Signed)
Megan Shannon is a 20 year old single 4 month pregnant Latino female who works as a Lawyer at [employer] She has no cardiac risk factors. She occasionally gets fleeting atypical chest pain. Her father did die of sudden cardiac death and apparently the autopsy showed that he had "enlarged heart". The patient has no carotid risk factors. She has occasional fleeting atypical chest pain which does not sound cardiovascular nature. I do not think further workup is required at this time.

## 2015-07-21 ENCOUNTER — Ambulatory Visit: Payer: Self-pay | Admitting: Cardiology

## 2015-07-25 ENCOUNTER — Ambulatory Visit: Payer: Self-pay | Admitting: Cardiovascular Disease

## 2015-08-04 MED FILL — ONDANSETRON ODT 8 MG TABLET: 8 | 7 days supply | Qty: 21 | Fill #0

## 2015-08-19 MED FILL — DICLEGIS DR 10-10 MG TABLET: 10-10 | 25 days supply | Qty: 100 | Fill #0

## 2015-09-09 MED FILL — PROMETHAZINE 25 MG TABLET: 25 | 4 days supply | Qty: 20 | Fill #0

## 2015-09-24 MED FILL — INTEGRA CAPSULE: 62.5-62.5-4 | 30 days supply | Qty: 30 | Fill #0

## 2015-09-25 MED FILL — PROMETHAZINE 25 MG TABLET: 25 | 4 days supply | Qty: 20 | Fill #1

## 2015-11-20 LAB — OB RESULTS CONSOLE GBS: STREP GROUP B AG: NEGATIVE

## 2015-12-17 ENCOUNTER — Inpatient Hospital Stay (HOSPITAL_COMMUNITY)
Admission: AD | Admit: 2015-12-17 | Payer: BLUE CROSS/BLUE SHIELD | Source: Ambulatory Visit | Admitting: Obstetrics and Gynecology

## 2015-12-19 ENCOUNTER — Telehealth (HOSPITAL_COMMUNITY): Payer: Self-pay | Admitting: *Deleted

## 2015-12-19 ENCOUNTER — Encounter (HOSPITAL_COMMUNITY): Payer: Self-pay | Admitting: *Deleted

## 2015-12-19 NOTE — Telephone Encounter (Signed)
Preadmission screen  

## 2015-12-22 ENCOUNTER — Encounter (HOSPITAL_COMMUNITY): Payer: Self-pay | Admitting: *Deleted

## 2015-12-25 NOTE — H&P (Signed)
Megan FastLisbeth Shannon is a 20 y.o. female G1 @ 9841 0/7 weeks revised by 6 week ultrasound scheduled for IOL on 12/25/15 due to postdates pregnancy.  Pt is without complaints.  Denies contractions, vaginal bleeding or LOF.  Active fetus noted. Pt's pregnancy has been complicated mild gestational thrombocytopenia, platelets in the low 130s.  Pt also had heart palpitations in the first trimester.  Pt had a negative work up by cardiology.    Maternal Medical History:  Contractions: Frequency: rare.    Fetal activity: Perceived fetal activity is normal.    Prenatal complications: Thrombocytopenia.   Prenatal Complications - Diabetes: none.    OB History    Gravida Para Term Preterm AB TAB SAB Ectopic Multiple Living   1              Past Medical History  Diagnosis Date  . Seasonal allergies   . Atypical chest pain    Past Surgical History  Procedure Laterality Date  . None     Family History: family history includes Diabetes in her brother and maternal grandmother; Thyroid nodules in her brother. Social History:  reports that she has never smoked. She has never used smokeless tobacco. She reports that she does not drink alcohol. Her drug history is not on file.   Prenatal Transfer Tool  Maternal Diabetes: No Genetic Screening: Declined Maternal Ultrasounds/Referrals: Normal Fetal Ultrasounds or other Referrals:  None Maternal Substance Abuse:  No Significant Maternal Medications:  None Significant Maternal Lab Results:  Lab values include: Group B Strep positive Other Comments:  Mild gestational thrombocytopenia  Review of Systems  Gastrointestinal: Negative for abdominal pain.      Last menstrual period 02/27/2015. Maternal Exam:  Uterine Assessment: Contraction frequency is rare.   Abdomen: Estimated fetal weight is 8 lbs.   Fetal presentation: vertex  Introitus: Normal vulva. Pelvis: adequate for delivery.   Cervix: Cervix evaluated by digital exam.   Loose  1/50/-3, well applied  Fetal Exam Fetal Monitor Review: Mode: fetoscope.   Baseline rate: 130s.  Pattern: accelerations present and no decelerations.   12/24/15  Fetal State Assessment: Category I - tracings are normal.     Physical Exam  Constitutional: She is oriented to person, place, and time. She appears well-developed and well-nourished. No distress.  HENT:  Head: Normocephalic and atraumatic.  Eyes: EOM are normal.  Neck: Normal range of motion.  Respiratory: No respiratory distress.  GI: There is no tenderness.  Musculoskeletal: She exhibits edema.  Neurological: She is alert and oriented to person, place, and time. She has normal reflexes.  Skin: Skin is warm and dry.  Psychiatric: She has a normal mood and affect.     Prenatal labs: ABO, Rh: A/Positive/-- (11/16 0000) Antibody: Negative (11/16 0000) Rubella: Immune (11/16 0000) RPR: Nonreactive (11/16 0000)  HBsAg: Negative (11/16 0000)  HIV: Non-reactive (11/16 0000)  GBS: Negative (06/08 0000)   Assessment/Plan: IUP @ 41 0/7 weeks Postdates Mild gestational thrombocytopenia.  IOL with Cytotec then Pitocin when cervix favorable. IV Fentanyl, Nitrous oxide, epidural ordered for pain management.    Geryl RankinsVARNADO, Jaeley Wiker 12/25/2015, 10:54 PM

## 2015-12-26 ENCOUNTER — Inpatient Hospital Stay (HOSPITAL_COMMUNITY): Payer: BLUE CROSS/BLUE SHIELD | Admitting: Anesthesiology

## 2015-12-26 ENCOUNTER — Inpatient Hospital Stay (HOSPITAL_COMMUNITY)
Admission: RE | Admit: 2015-12-26 | Discharge: 2015-12-28 | DRG: 774 | Disposition: A | Discharge status: Home / Self Care | Payer: BLUE CROSS/BLUE SHIELD | Source: Ambulatory Visit | Attending: Obstetrics and Gynecology | Admitting: Obstetrics and Gynecology

## 2015-12-26 ENCOUNTER — Encounter (HOSPITAL_COMMUNITY): Payer: Self-pay

## 2015-12-26 DIAGNOSIS — D6959 Other secondary thrombocytopenia: Secondary | ICD-10-CM | POA: Diagnosis present

## 2015-12-26 DIAGNOSIS — Z3A41 41 weeks gestation of pregnancy: Secondary | ICD-10-CM | POA: Diagnosis not present

## 2015-12-26 DIAGNOSIS — K219 Gastro-esophageal reflux disease without esophagitis: Secondary | ICD-10-CM | POA: Diagnosis present

## 2015-12-26 DIAGNOSIS — Z833 Family history of diabetes mellitus: Secondary | ICD-10-CM

## 2015-12-26 DIAGNOSIS — O9962 Diseases of the digestive system complicating childbirth: Secondary | ICD-10-CM | POA: Diagnosis present

## 2015-12-26 DIAGNOSIS — O9912 Other diseases of the blood and blood-forming organs and certain disorders involving the immune mechanism complicating childbirth: Secondary | ICD-10-CM | POA: Diagnosis present

## 2015-12-26 DIAGNOSIS — O99824 Streptococcus B carrier state complicating childbirth: Secondary | ICD-10-CM | POA: Diagnosis present

## 2015-12-26 DIAGNOSIS — O480 Post-term pregnancy: Secondary | ICD-10-CM | POA: Diagnosis present

## 2015-12-26 LAB — TYPE AND SCREEN
ABO/RH(D): A POS
Antibody Screen: NEGATIVE

## 2015-12-26 LAB — CBC
HCT: 31.1 % — ABNORMAL LOW (ref 36.0–46.0)
HCT: 32.2 % — ABNORMAL LOW (ref 36.0–46.0)
HEMOGLOBIN: 9.7 g/dL — AB (ref 12.0–15.0)
Hemoglobin: 9.8 g/dL — ABNORMAL LOW (ref 12.0–15.0)
MCH: 22.4 pg — AB (ref 26.0–34.0)
MCH: 22 pg — AB (ref 26.0–34.0)
MCHC: 31.5 g/dL (ref 30.0–36.0)
MCHC: 30.1 g/dL (ref 30.0–36.0)
MCV: 71.2 fL — AB (ref 78.0–100.0)
MCV: 73 fL — AB (ref 78.0–100.0)
PLATELETS: 127 10*3/uL — AB (ref 150–400)
PLATELETS: 123 10*3/uL — AB (ref 150–400)
RBC: 4.37 MIL/uL (ref 3.87–5.11)
RBC: 4.41 MIL/uL (ref 3.87–5.11)
RDW: 18.1 % — ABNORMAL HIGH (ref 11.5–15.5)
RDW: 18.3 % — ABNORMAL HIGH (ref 11.5–15.5)
WBC: 8.5 10*3/uL (ref 4.0–10.5)
WBC: 10.3 10*3/uL (ref 4.0–10.5)

## 2015-12-26 LAB — RPR: RPR: NONREACTIVE

## 2015-12-26 LAB — ABO/RH: ABO/RH(D): A POS

## 2015-12-26 MED ORDER — OXYTOCIN 40 UNITS IN LACTATED RINGERS INFUSION - SIMPLE MED
1.0000 m[IU]/min | INTRAVENOUS | Status: DC
  Administered 2015-12-26: 2 m[IU]/min via INTRAVENOUS
  Filled 2015-12-26 (×2): qty 1000

## 2015-12-26 MED ORDER — LACTATED RINGERS IV SOLN
INTRAVENOUS | Status: DC
  Administered 2015-12-26 (×4): via INTRAVENOUS

## 2015-12-26 MED ORDER — PHENYLEPHRINE 40 MCG/ML (10ML) SYRINGE FOR IV PUSH (FOR BLOOD PRESSURE SUPPORT): 80.0000 ug | PREFILLED_SYRINGE | INTRAVENOUS | Status: DC | PRN

## 2015-12-26 MED ORDER — MISOPROSTOL 25 MCG QUARTER TABLET
25.0000 ug | ORAL_TABLET | ORAL | Status: DC | PRN
  Administered 2015-12-26 (×2): 25 ug via VAGINAL
  Filled 2015-12-26 (×2): qty 0.25

## 2015-12-26 MED ORDER — EPHEDRINE 5 MG/ML INJ: 10.0000 mg | INTRAVENOUS | Status: DC | PRN

## 2015-12-26 MED ORDER — DIPHENHYDRAMINE HCL 50 MG/ML IJ SOLN: 12.5000 mg | INTRAMUSCULAR | Status: DC | PRN

## 2015-12-26 MED ORDER — OXYCODONE-ACETAMINOPHEN 5-325 MG PO TABS: 1.0000 | ORAL_TABLET | ORAL | Status: DC | PRN

## 2015-12-26 MED ORDER — MISOPROSTOL 200 MCG PO TABS
ORAL_TABLET | ORAL | Status: AC
  Administered 2015-12-26: 800 ug
  Filled 2015-12-26: qty 5

## 2015-12-26 MED ORDER — TERBUTALINE SULFATE 1 MG/ML IJ SOLN: 0.2500 mg | Freq: Once | INTRAMUSCULAR | Status: DC | PRN

## 2015-12-26 MED ORDER — ACETAMINOPHEN 325 MG PO TABS: 650.0000 mg | ORAL_TABLET | ORAL | Status: DC | PRN

## 2015-12-26 MED ORDER — OXYTOCIN BOLUS FROM INFUSION
500.0000 mL | INTRAVENOUS | Status: DC
  Administered 2015-12-26: 500 mL via INTRAVENOUS

## 2015-12-26 MED ORDER — FENTANYL CITRATE (PF) 100 MCG/2ML IJ SOLN
50.0000 ug | INTRAMUSCULAR | Status: DC | PRN
  Administered 2015-12-26: 50 ug via INTRAVENOUS
  Filled 2015-12-26: qty 2

## 2015-12-26 MED ORDER — OXYCODONE-ACETAMINOPHEN 5-325 MG PO TABS: 2.0000 | ORAL_TABLET | ORAL | Status: DC | PRN

## 2015-12-26 MED ORDER — OXYTOCIN 40 UNITS IN LACTATED RINGERS INFUSION - SIMPLE MED
2.5000 [IU]/h | INTRAVENOUS | Status: DC
  Filled 2015-12-26: qty 1000

## 2015-12-26 MED ORDER — ZOLPIDEM TARTRATE 5 MG PO TABS: 5.0000 mg | ORAL_TABLET | Freq: Every evening | ORAL | Status: DC | PRN

## 2015-12-26 MED ORDER — LACTATED RINGERS IV SOLN: 500.0000 mL | Freq: Once | INTRAVENOUS | Status: DC

## 2015-12-26 MED ORDER — LIDOCAINE HCL (PF) 1 % IJ SOLN
INTRAMUSCULAR | Status: DC | PRN
  Administered 2015-12-26 (×2): 6 mL via EPIDURAL

## 2015-12-26 MED ORDER — FENTANYL 2.5 MCG/ML BUPIVACAINE 1/10 % EPIDURAL INFUSION (WH - ANES)
14.0000 mL/h | INTRAMUSCULAR | Status: DC | PRN
  Administered 2015-12-26 (×2): 14 mL/h via EPIDURAL
  Filled 2015-12-26 (×2): qty 125

## 2015-12-26 MED ORDER — LIDOCAINE HCL (PF) 1 % IJ SOLN
30.0000 mL | INTRAMUSCULAR | Status: DC | PRN
Start: 2015-12-26 — End: 2015-12-27
  Filled 2015-12-26: qty 30

## 2015-12-26 MED ORDER — SOD CITRATE-CITRIC ACID 500-334 MG/5ML PO SOLN: 30.0000 mL | ORAL | Status: DC | PRN

## 2015-12-26 MED ORDER — PHENYLEPHRINE 40 MCG/ML (10ML) SYRINGE FOR IV PUSH (FOR BLOOD PRESSURE SUPPORT)
80.0000 ug | PREFILLED_SYRINGE | INTRAVENOUS | Status: DC | PRN
  Filled 2015-12-26: qty 10

## 2015-12-26 MED ORDER — LACTATED RINGERS IV SOLN
500.0000 mL | Freq: Once | INTRAVENOUS | Status: AC
  Administered 2015-12-26: 500 mL via INTRAVENOUS

## 2015-12-26 MED ORDER — LACTATED RINGERS IV SOLN
500.0000 mL | INTRAVENOUS | Status: DC | PRN
  Administered 2015-12-27: 1000 mL via INTRAVENOUS

## 2015-12-26 MED ORDER — HYDROXYZINE HCL 50 MG PO TABS: 50.0000 mg | ORAL_TABLET | Freq: Four times a day (QID) | ORAL | Status: DC | PRN

## 2015-12-26 MED ORDER — ONDANSETRON HCL 4 MG/2ML IJ SOLN
4.0000 mg | Freq: Four times a day (QID) | INTRAMUSCULAR | Status: DC | PRN
  Administered 2015-12-27: 4 mg via INTRAVENOUS
  Filled 2015-12-26: qty 2

## 2015-12-26 NOTE — Progress Notes (Signed)
In to assess pt. Pt s/p epidural 5 minutes ago per RN. Cervix 3-4 cm/80/-1, intact membranes. Cat 1 tracing. Pitocin infusion.  IUP @ 412/7 weeks Postdates induction.  Doing well. S/p epidural. Continue Pitocin. Anticipate SVD.

## 2015-12-26 NOTE — Anesthesia Pain Management Evaluation Note (Signed)
  CRNA Pain Management Visit Note  Patient: Megan Shannon, 20 y.o., female  "Hello I am a member of the anesthesia team at [facility]. We have an anesthesia team available at all times to provide care throughout the hospital, including epidural management and anesthesia for C-section. I don't know your plan for the delivery whether it a natural birth, water birth, IV sedation, nitrous supplementation, doula or epidural, but we want to meet your pain goals."   1.Was your pain managed to your expectations on prior hospitalizations?   Yes  2.What is your expectation for pain management during this hospitalization?    Epidural  3.How can we help you reach that goal? Epidural  Record the patient's initial score and the patient's pain goal.   Pain: 0  Pain Goal: 5  [facility] wants you to be able to say your pain was always managed very well.  [facility],Megan Shannon 12/26/2015

## 2015-12-26 NOTE — Anesthesia Procedure Notes (Signed)
Epidural  Start time: 12/26/2015 1:52 PM End time: 12/26/2015 2:09 PM  Staffing Anesthesiologist: Jairo BenJACKSON, Roxene Alviar Performed by: anesthesiologist   Preanesthetic Checklist Completed: patient identified, surgical consent, pre-op evaluation, timeout performed, IV checked, risks and benefits discussed and monitors and equipment checked  Epidural Patient position: sitting Prep: site prepped and draped and DuraPrep Patient monitoring: blood pressure, continuous pulse ox and heart rate Approach: midline Location: L2-L3 Injection technique: LOR air  Needle:  Needle type: Tuohy  Needle gauge: 17 G Needle insertion depth: 6 cm Catheter type: closed end flexible Catheter size: 19 Gauge Catheter at skin depth: 12 cm Test dose: negative (1% lidocaine)  Additional Notes Pt identified in Labor room.  Monitors applied. Working IV access confirmed. Sterile prep, drape lumbar spine.  1% lido local L 2,3.  #17ga Touhy LOR air at 6 cm L 2,3, cath in easily to 12 cm skin. Test dose OK, cath dosed and infusion begun.  Patient asymptomatic, VSS, no heme aspirated, tolerated well.  Sandford Craze Pammie Chirino, MD Reason for block:procedure for pain

## 2015-12-26 NOTE — Progress Notes (Signed)
Megan Shannon is a 20 y.o. G1P0 at 455w2d   Subjective: Pt is comfortable.  Denies contractions.  Active fetus.  Objective: BP 124/70 mmHg  Pulse 78  Temp(Src) 98.2 F (36.8 C) (Oral)  Resp 16  Ht 5\' 6"  (1.676 m)  Wt 89.359 kg (197 lb)  BMI 31.81 kg/m2  LMP 02/27/2015 (Approximate)      FHT:  FHR: 140s bpm, variability: moderate,  accelerations:  Present,  decelerations:  Present Variable UC:   irregular SVE:   Dilation: 3 Effacement (%): 80 Station: -3 Exam by:: Malva CoganA. Schwarz RN   Labs: Lab Results  Component Value Date   WBC 8.5 12/26/2015   HGB 9.8* 12/26/2015   HCT 31.1* 12/26/2015   MCV 71.2* 12/26/2015   PLT 127* 12/26/2015    Assessment / Plan: IUP @ 41 2/7 weeks S/p Cytotec x 2 doses  Labor: Start Pitocin at 1000 Preeclampsia:  No s/sxs Fetal Wellbeing:  Category II Pain Control:  Labor support without medications and Unsure I/D:  n/a Anticipated MOD:  NSVD  Megan Shannon 12/26/2015, 7:26 AM

## 2015-12-26 NOTE — Anesthesia Preprocedure Evaluation (Addendum)
Anesthesia Evaluation  Patient identified by MRN, date of birth, ID band Patient awake    Reviewed: Allergy & Precautions, NPO status , Patient's Chart, lab work & pertinent test results  History of Anesthesia Complications Negative for: history of anesthetic complications  Airway Mallampati: III  TM Distance: >3 FB Neck ROM: Full    Dental  (+) Dental Advisory Given   Pulmonary neg pulmonary ROS,    breath sounds clear to auscultation       Cardiovascular negative cardio ROS   Rhythm:Regular Rate:Normal     Neuro/Psych negative neurological ROS     GI/Hepatic Neg liver ROS, GERD  Poorly Controlled,  Endo/Other  negative endocrine ROS  Renal/GU negative Renal ROS     Musculoskeletal   Abdominal   Peds  Hematology  (+) Blood dyscrasia (thrombocytopenia), , plt 123k, Hb 9.7   Anesthesia Other Findings   Reproductive/Obstetrics (+) Pregnancy                            Anesthesia Physical Anesthesia Plan  ASA: II  Anesthesia Plan: Epidural   Post-op Pain Management:    Induction:   Airway Management Planned: Natural Airway  Additional Equipment:   Intra-op Plan:   Post-operative Plan:   Informed Consent: I have reviewed the patients History and Physical, chart, labs and discussed the procedure including the risks, benefits and alternatives for the proposed anesthesia with the patient or authorized representative who has indicated his/her understanding and acceptance.   Dental advisory given  Plan Discussed with:   Anesthesia Plan Comments: (Patient identified. Risks/Benefits/Options discussed with patient including but not limited to bleeding, infection, nerve damage, paralysis, failed block, incomplete pain control, headache, blood pressure changes, nausea, vomiting, reactions to medication both or allergic, itching and postpartum back pain. Confirmed with bedside nurse the  patient's most recent platelet count. Confirmed with patient that they are not currently taking any anticoagulation, have any bleeding history or any family history of bleeding disorders. Patient expressed understanding and wished to proceed. All questions were answered.  )        Anesthesia Quick Evaluation

## 2015-12-26 NOTE — Progress Notes (Addendum)
Megan Shannon is a 20 y.o. G1P0 at 543w2d   Subjective: Pt still feeling pressure and discomfort with contractions.  Objective: BP 127/73 mmHg  Pulse 92  Temp(Src) 98.9 F (37.2 C) (Axillary)  Resp 20  Ht 5\' 6"  (1.676 m)  Wt 89.359 kg (197 lb)  BMI 31.81 kg/m2  SpO2 98%  LMP 02/27/2015 (Approximate)     Gen:  O2 face mask in place. FHT:  FHR: 150s bpm, variability: moderate,  accelerations:  Present,  decelerations:  Present late,early UC:   regular, every 1-2 minutes SVE:   Dilation: Lip/rim Effacement (%): 100 Station: 0, +1 Exam by:: Dr. Idamae SchullerVarnardo  IUPC placed after AROM. Clear fluid noted. +Fetal scalp stimulation and active fetal movement appreciated.  Labs: Lab Results  Component Value Date   WBC 10.3 12/26/2015   HGB 9.7* 12/26/2015   HCT 32.2* 12/26/2015   MCV 73.0* 12/26/2015   PLT 123* 12/26/2015    Assessment / Plan: IUP @ 41 2/7 weeks Post dates IOL. Late decelerations likely due to hyperstimulation.  Fetal status reassuring clinically.  Labor: Pitocin discontinued. Continue O2 per face mask. Preeclampsia:  No s/sxs. Fetal Wellbeing:  Category III and Improving after decrease/discontinuation of Pitocin. Pain Control:  Epidural I/D:  n/a Anticipated MOD:  NSVD  Megan Shannon 12/26/2015, 6:00 PM

## 2015-12-27 ENCOUNTER — Encounter (HOSPITAL_COMMUNITY): Payer: Self-pay

## 2015-12-27 LAB — CBC
HCT: 23.8 % — ABNORMAL LOW (ref 36.0–46.0)
HCT: 24.6 % — ABNORMAL LOW (ref 36.0–46.0)
Hemoglobin: 7.5 g/dL — ABNORMAL LOW (ref 12.0–15.0)
Hemoglobin: 7.6 g/dL — ABNORMAL LOW (ref 12.0–15.0)
MCH: 22.5 pg — ABNORMAL LOW (ref 26.0–34.0)
MCH: 22.3 pg — ABNORMAL LOW (ref 26.0–34.0)
MCHC: 31.5 g/dL (ref 30.0–36.0)
MCHC: 30.9 g/dL (ref 30.0–36.0)
MCV: 71.3 fL — ABNORMAL LOW (ref 78.0–100.0)
MCV: 72.1 fL — ABNORMAL LOW (ref 78.0–100.0)
Platelets: 106 10*3/uL — ABNORMAL LOW (ref 150–400)
Platelets: 118 10*3/uL — ABNORMAL LOW (ref 150–400)
RBC: 3.34 MIL/uL — ABNORMAL LOW (ref 3.87–5.11)
RBC: 3.41 MIL/uL — ABNORMAL LOW (ref 3.87–5.11)
RDW: 18.3 % — ABNORMAL HIGH (ref 11.5–15.5)
RDW: 18.5 % — ABNORMAL HIGH (ref 11.5–15.5)
WBC: 13.3 10*3/uL — ABNORMAL HIGH (ref 4.0–10.5)
WBC: 17.4 10*3/uL — ABNORMAL HIGH (ref 4.0–10.5)

## 2015-12-27 LAB — GLUCOSE, CAPILLARY: GLUCOSE-CAPILLARY: 88 mg/dL (ref 65–99)

## 2015-12-27 MED ORDER — ONDANSETRON HCL 4 MG PO TABS: 4.0000 mg | ORAL_TABLET | ORAL | Status: DC | PRN

## 2015-12-27 MED ORDER — BENZOCAINE-MENTHOL 20-0.5 % EX AERO
1.0000 "application " | INHALATION_SPRAY | CUTANEOUS | Status: DC | PRN
  Filled 2015-12-27: qty 56

## 2015-12-27 MED ORDER — ZOLPIDEM TARTRATE 5 MG PO TABS: 5.0000 mg | ORAL_TABLET | Freq: Every evening | ORAL | Status: DC | PRN

## 2015-12-27 MED ORDER — ACETAMINOPHEN 325 MG PO TABS: 650.0000 mg | ORAL_TABLET | ORAL | Status: DC | PRN

## 2015-12-27 MED ORDER — SENNOSIDES-DOCUSATE SODIUM 8.6-50 MG PO TABS
2.0000 | ORAL_TABLET | ORAL | Status: DC
  Administered 2015-12-28: 2 via ORAL
  Filled 2015-12-27: qty 2

## 2015-12-27 MED ORDER — TETANUS-DIPHTH-ACELL PERTUSSIS 5-2.5-18.5 LF-MCG/0.5 IM SUSP
0.5000 mL | Freq: Once | INTRAMUSCULAR | Status: AC
  Administered 2015-12-28: 0.5 mL via INTRAMUSCULAR
  Filled 2015-12-27: qty 0.5

## 2015-12-27 MED ORDER — DIPHENHYDRAMINE HCL 25 MG PO CAPS
25.0000 mg | ORAL_CAPSULE | Freq: Four times a day (QID) | ORAL | Status: DC | PRN
Start: 2015-12-27 — End: 2015-12-28

## 2015-12-27 MED ORDER — PRENATAL MULTIVITAMIN CH
1.0000 | ORAL_TABLET | Freq: Every day | ORAL | Status: DC
  Administered 2015-12-27 – 2015-12-28 (×2): 1 via ORAL
  Filled 2015-12-27 (×2): qty 1

## 2015-12-27 MED ORDER — IBUPROFEN 600 MG PO TABS
600.0000 mg | ORAL_TABLET | Freq: Four times a day (QID) | ORAL | Status: DC
  Administered 2015-12-27 – 2015-12-28 (×5): 600 mg via ORAL
  Filled 2015-12-27 (×5): qty 1

## 2015-12-27 MED ORDER — OXYTOCIN 40 UNITS IN LACTATED RINGERS INFUSION - SIMPLE MED
5.0000 [IU]/h | INTRAVENOUS | Status: DC
  Administered 2015-12-27: 5 [IU]/h via INTRAVENOUS

## 2015-12-27 MED ORDER — SIMETHICONE 80 MG PO CHEW: 80.0000 mg | CHEWABLE_TABLET | ORAL | Status: DC | PRN

## 2015-12-27 MED ORDER — ONDANSETRON HCL 4 MG/2ML IJ SOLN: 4.0000 mg | INTRAMUSCULAR | Status: DC | PRN

## 2015-12-27 MED ORDER — COCONUT OIL OIL: 1.0000 "application " | TOPICAL_OIL | Status: DC | PRN

## 2015-12-27 MED ORDER — MISOPROSTOL 200 MCG PO TABS
800.0000 ug | ORAL_TABLET | Freq: Once | ORAL | Status: DC
  Filled 2015-12-27: qty 4

## 2015-12-27 MED ORDER — DIBUCAINE 1 % RE OINT: 1.0000 "application " | TOPICAL_OINTMENT | RECTAL | Status: DC | PRN

## 2015-12-27 MED ORDER — WITCH HAZEL-GLYCERIN EX PADS: 1.0000 "application " | MEDICATED_PAD | CUTANEOUS | Status: DC | PRN

## 2015-12-27 NOTE — Lactation Note (Signed)
This note was copied from a baby's chart. Lactation Consultation Note  Patient Name: Megan Shannon ZOXWR'UToday's Date: 12/27/2015 Reason for consult: Initial assessment  Baby 21 hours old. Mom reports that she wants to nurse and offer formula, and that this is what she has been doing in hospital. Discussed supply and demand and progression of milk coming to volume, and ENC mom to put baby to breast first with each feeding before supplementing. Mom latched baby to breast in cradle position. Enc mom to support baby's head and assisted with moving baby into cross-cradle position. Also, demonstrated how to flange baby's lower lip and mom reported increased comfort. Enc mom to call for assistance as needed. [facility], aware of OP/BFSG and LC phone line assistance after D/C.  Maternal Data    Feeding Feeding Type: Breast Fed Nipple Type: Slow - flow Length of feed:  (LC assessed first 10 minutes of BF. )  LATCH Score/Interventions Latch: Grasps breast easily, tongue down, lips flanged, rhythmical sucking.  Audible Swallowing: A few with stimulation  Type of Nipple: Everted at rest and after stimulation  Comfort (Breast/Nipple): Soft / non-tender     Hold (Positioning): Assistance needed to correctly position infant at breast and maintain latch. Intervention(s): Breastfeeding basics reviewed;Support Pillows;Position options  LATCH Score: 8  Lactation Tools Discussed/Used     Consult Status Consult Status: Follow-up Date: 12/28/15 Follow-up type: In-patient    Geralynn OchsWILLIARD, Armin Yerger 12/27/2015, 8:52 PM

## 2015-12-27 NOTE — Anesthesia Postprocedure Evaluation (Signed)
Anesthesia Post Note  Patient: Megan Shannon  Procedure(s) Performed: * No procedures listed *  Patient location during evaluation: Mother Baby Anesthesia Type: Epidural Level of consciousness: awake and alert and oriented Pain management: satisfactory to patient Vital Signs Assessment: post-procedure vital signs reviewed and stable Respiratory status: spontaneous breathing and nonlabored ventilation Cardiovascular status: stable Postop Assessment: no headache, no backache, no signs of nausea or vomiting, adequate PO intake and patient able to bend at knees (patient up walking) Anesthetic complications: no     Last Vitals:  Filed Vitals:   12/27/15 0247 12/27/15 0343  BP: 112/63 102/55  Pulse: 84 91  Temp: 37.6 C 37.3 C  Resp: 16 18    Last Pain:  Filed Vitals:   12/27/15 0643  PainSc: 0-No pain   Pain Goal:                 Madison HickmanGREGORY,Emmamae Mcnamara

## 2015-12-28 MED ORDER — IBUPROFEN 600 MG PO TABS: 600.0000 mg | ORAL_TABLET | Freq: Four times a day (QID) | ORAL | Status: DC

## 2015-12-28 NOTE — Lactation Note (Signed)
This note was copied from a baby's chart. Lactation Consultation Note  Patient Name: Boy Windy FastLisbeth Garcia-Mosqueda EAVWU'JToday's Date: 12/28/2015 Reason for consult: Follow-up assessment  With this mom and term baby, now 5133 hours old. Mom has been breast feeding and then offering formula as suplement. Mom is aware this is not needed, but is her choice. On exam, her breasts are filling, with easily expressed colostrum. Mom knows to call for questions/concerns.    Maternal Data    Feeding Feeding Type: Breast Fed  LATCH Score/Interventions                      Lactation Tools Discussed/Used     Consult Status Consult Status: Follow-up Date: 12/29/15 Follow-up type: In-patient    Alfred LevinsLee, Natonya Finstad Anne 12/28/2015, 8:38 AM

## 2015-12-28 NOTE — Discharge Summary (Signed)
Obstetric Discharge Summary Reason for Admission: induction of labor Prenatal Procedures: ultrasound Intrapartum Procedures: spontaneous vaginal delivery Postpartum Procedures: none Complications-Operative and Postpartum: none HEMOGLOBIN  Date Value Ref Range Status  12/27/2015 7.6* 12.0 - 15.0 g/dL Final   HCT  Date Value Ref Range Status  12/27/2015 24.6* 36.0 - 46.0 % Final    Physical Exam:  General: alert and cooperative Lochia: appropriate Uterine Fundus: firm Incision: na DVT Evaluation: No evidence of DVT seen on physical exam.  Discharge Diagnoses: Term Pregnancy-delivered.  Pt had PP hemorrhage of 750 cc.  She was symptomatic at first but soon adjusted and declined blood transfusion.  She will be sent home on iron  Discharge Information: Date: 12/28/2015 Activity: pelvic rest Diet: routine Medications: PNV, Ibuprofen and Iron Condition: stable Instructions: refer to practice specific booklet Discharge to: home Follow-up Information    [facility] weeks.   Specialty:  Obstetrics and Gynecology   Contact information:   [address] [place] [id number] [phone]       Newborn Data: Live born female  Birth Weight: 10 lb 3.3 oz (4630 g) APGAR: 6, 9  Home with mother.  Megan Shannon 12/28/2015, 3:39 PM

## 2015-12-28 NOTE — Discharge Instructions (Signed)

## 2016-02-06 MED FILL — HEATHER TABLET: 0.35 | 84 days supply | Qty: 84 | Fill #0

## 2016-02-10 MED FILL — INTEGRA CAPSULE: 62.5-62.5-4 | 30 days supply | Qty: 30 | Fill #1

## 2016-05-14 MED FILL — TRI-PREVIFEM TABLET: 0.18/0.215/ | 28 days supply | Qty: 28 | Fill #0

## 2016-06-15 MED FILL — TRI-PREVIFEM TABLET: 0.18/0.215/ | 28 days supply | Qty: 28 | Fill #1

## 2016-07-12 MED FILL — TRI-PREVIFEM TABLET: 0.18/0.215/ | 28 days supply | Qty: 28 | Fill #2

## 2016-08-09 MED FILL — TRI-PREVIFEM TABLET: 0.18/0.215/ | 28 days supply | Qty: 28 | Fill #3

## 2016-09-06 MED FILL — TRI-PREVIFEM TABLET: 0.18/0.215/ | 28 days supply | Qty: 28 | Fill #4

## 2016-10-05 MED FILL — TRI-PREVIFEM TABLET: 0.18/0.215/ | 28 days supply | Qty: 28 | Fill #5

## 2016-11-02 MED FILL — TRI-PREVIFEM TABLET: 0.18/0.215/ | 28 days supply | Qty: 28 | Fill #6

## 2016-11-23 MED FILL — TRI-PREVIFEM TABLET: 0.18/0.215/ | 28 days supply | Qty: 28 | Fill #7

## 2016-12-29 MED FILL — TRI-PREVIFEM TABLET: 0.18/0.215/ | 28 days supply | Qty: 28 | Fill #8

## 2017-01-24 MED FILL — TRI-PREVIFEM TABLET: 0.18/0.215/ | 28 days supply | Qty: 28 | Fill #9

## 2017-02-18 MED FILL — TRI-PREVIFEM TABLET: 0.18/0.215/ | 28 days supply | Qty: 28 | Fill #10

## 2017-02-22 ENCOUNTER — Telehealth (INDEPENDENT_AMBULATORY_CARE_PROVIDER_SITE_OTHER): Payer: Self-pay | Admitting: Orthopaedic Surgery

## 2017-02-22 ENCOUNTER — Encounter (INDEPENDENT_AMBULATORY_CARE_PROVIDER_SITE_OTHER): Payer: Self-pay

## 2017-02-22 NOTE — Telephone Encounter (Signed)
Printed MyChart code for pt to sign up for MyChart at pts request.

## 2017-03-14 ENCOUNTER — Other Ambulatory Visit (INDEPENDENT_AMBULATORY_CARE_PROVIDER_SITE_OTHER): Payer: Self-pay | Admitting: Orthopaedic Surgery

## 2017-03-14 ENCOUNTER — Ambulatory Visit (INDEPENDENT_AMBULATORY_CARE_PROVIDER_SITE_OTHER): Payer: Self-pay | Admitting: Orthopaedic Surgery

## 2017-03-14 MED ORDER — MELOXICAM 7.5 MG PO TABS
15.0000 mg | ORAL_TABLET | Freq: Every day | ORAL | 2 refills | Status: DC | PRN
Start: 2017-03-14 — End: 2017-04-07

## 2017-03-14 MED ORDER — TIZANIDINE HCL 4 MG PO TABS: 4.0000 mg | ORAL_TABLET | Freq: Four times a day (QID) | ORAL | 2 refills | Status: DC | PRN

## 2017-03-14 MED FILL — tiZANidine HCL 4 MG TABS: 4 | 8 days supply | Qty: 30 | Fill #0

## 2017-03-14 MED FILL — MELOXICAM 7.5 MG TABLET: 7.5 | 15 days supply | Qty: 30 | Fill #0

## 2017-03-15 ENCOUNTER — Other Ambulatory Visit (INDEPENDENT_AMBULATORY_CARE_PROVIDER_SITE_OTHER): Payer: Self-pay | Admitting: Orthopaedic Surgery

## 2017-03-15 MED ORDER — PREDNISONE 10 MG (21) PO TBPK
ORAL_TABLET | ORAL | 0 refills | Status: DC
Start: 2017-03-15 — End: 2017-04-07

## 2017-03-15 MED FILL — predniSONE 10 MG TABS: 10 | 6 days supply | Qty: 21 | Fill #0

## 2017-03-22 ENCOUNTER — Other Ambulatory Visit (INDEPENDENT_AMBULATORY_CARE_PROVIDER_SITE_OTHER): Payer: Self-pay | Admitting: Orthopaedic Surgery

## 2017-03-22 MED ORDER — GABAPENTIN 100 MG PO CAPS: 100.0000 mg | ORAL_CAPSULE | Freq: Three times a day (TID) | ORAL | 0 refills | Status: DC

## 2017-03-22 MED FILL — NORG-EE 0.18-0.215-0.25/0.0: 0.18/0.215/ | 28 days supply | Qty: 28 | Fill #11

## 2017-03-22 MED FILL — GABAPENTIN 100 MG CAP: 100 | 10 days supply | Qty: 30 | Fill #0

## 2017-03-23 ENCOUNTER — Ambulatory Visit (HOSPITAL_COMMUNITY)
Admission: RE | Admit: 2017-03-23 | Discharge: 2017-03-23 | Disposition: A | Discharge status: Home / Self Care | Payer: PRIVATE HEALTH INSURANCE | Source: Ambulatory Visit | Attending: Family Medicine | Admitting: Family Medicine

## 2017-03-23 ENCOUNTER — Other Ambulatory Visit (HOSPITAL_COMMUNITY): Payer: Self-pay | Admitting: Family Medicine

## 2017-03-23 DIAGNOSIS — M5416 Radiculopathy, lumbar region: Secondary | ICD-10-CM | POA: Insufficient documentation

## 2017-03-23 DIAGNOSIS — R52 Pain, unspecified: Secondary | ICD-10-CM

## 2017-03-28 ENCOUNTER — Other Ambulatory Visit (HOSPITAL_COMMUNITY): Payer: Self-pay | Admitting: Orthopedic Surgery

## 2017-03-28 DIAGNOSIS — S335XXA Sprain of ligaments of lumbar spine, initial encounter: Secondary | ICD-10-CM

## 2017-03-28 MED FILL — CYCLOBENZAPRINE 5 MG TABLET: 5 | 30 days supply | Qty: 60 | Fill #0

## 2017-03-28 MED FILL — METHYLPREDNISOLONE 4 MG TAB: 4 | 6 days supply | Qty: 21 | Fill #0

## 2017-03-29 ENCOUNTER — Ambulatory Visit (HOSPITAL_COMMUNITY)
Admission: RE | Admit: 2017-03-29 | Discharge: 2017-03-29 | Disposition: A | Discharge status: Home / Self Care | Payer: PRIVATE HEALTH INSURANCE | Source: Ambulatory Visit | Attending: Orthopedic Surgery | Admitting: Orthopedic Surgery

## 2017-03-29 DIAGNOSIS — M5416 Radiculopathy, lumbar region: Secondary | ICD-10-CM | POA: Insufficient documentation

## 2017-03-29 DIAGNOSIS — M1288 Other specific arthropathies, not elsewhere classified, other specified site: Secondary | ICD-10-CM | POA: Insufficient documentation

## 2017-03-29 DIAGNOSIS — M5127 Other intervertebral disc displacement, lumbosacral region: Secondary | ICD-10-CM | POA: Insufficient documentation

## 2017-03-29 DIAGNOSIS — S335XXA Sprain of ligaments of lumbar spine, initial encounter: Secondary | ICD-10-CM

## 2017-03-29 IMAGING — MR MR LUMBAR SPINE W/O CM
4 of 5 series · 19 of 48 positions shown · non-contrast
Comparison: Lumbar spine radiographs [DATE].

CLINICAL DATA: Lifting injury [DATE]. Worsening low back pain
with BILATERAL leg burning numbness and tingling.

EXAM:
MRI LUMBAR SPINE WITHOUT CONTRAST
TECHNIQUE: Multiplanar, multisequence MR imaging of the lumbar spine was
performed. No intravenous contrast was administered.

[Series 3: T1 · sagittal · 4.0mm · 0.47mm/px · 3 of 15 slices shown (1 of 2)]
[im 3/15]
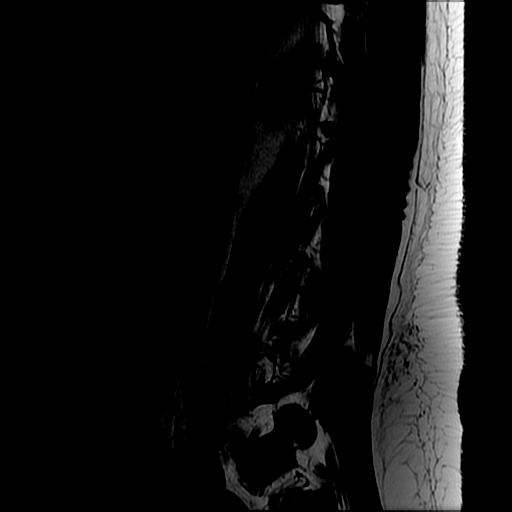
[im 8/15]
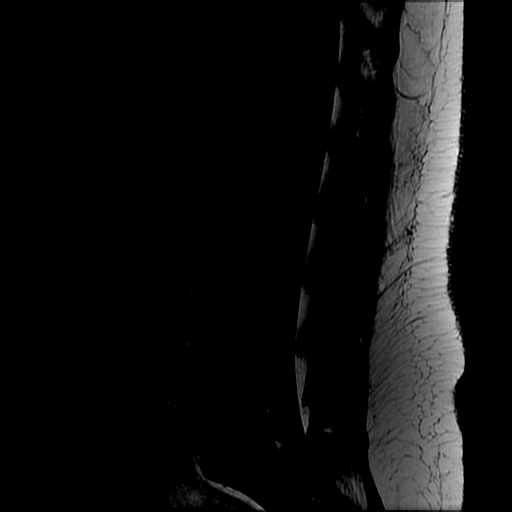
[im 12/15]
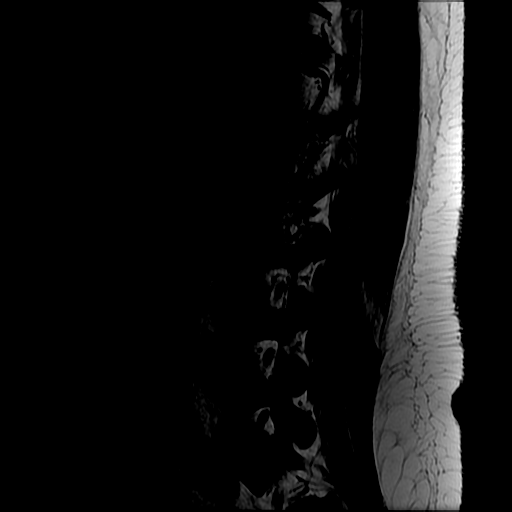

[Series 4: T2 · sagittal · 4.0mm · 0.47mm/px · 7 of 15 slices shown (1 of 2)]
[im 1/15]
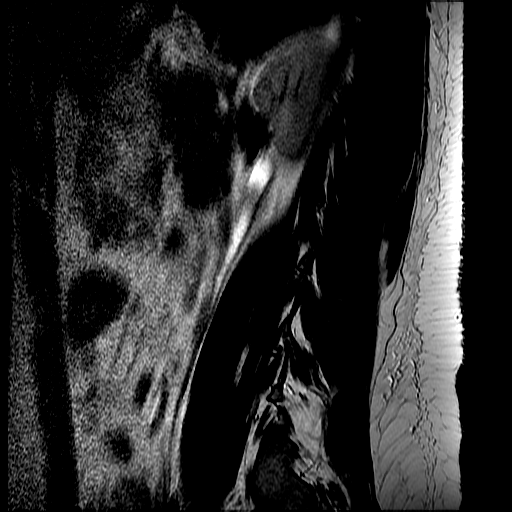
[im 3/15]
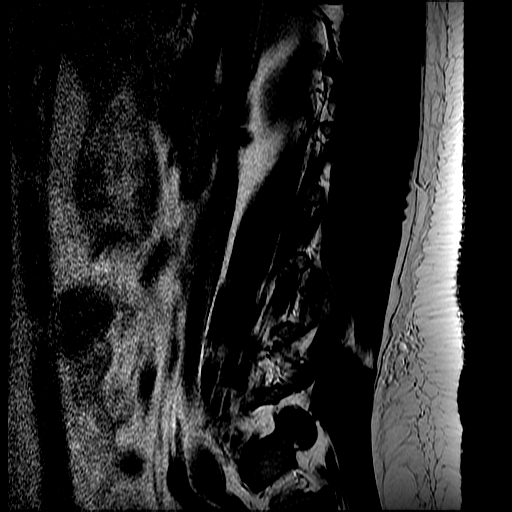
[im 5/15]
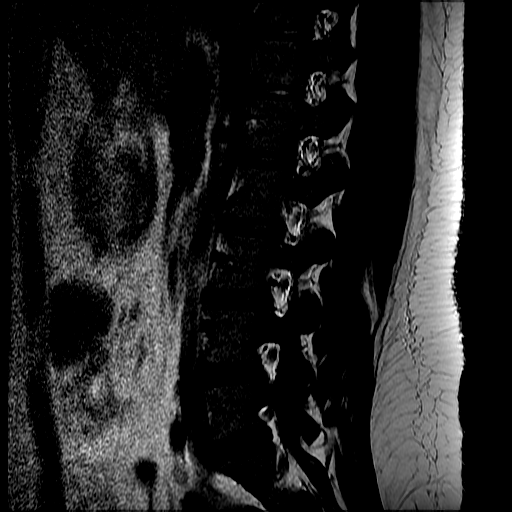
[im 8/15]
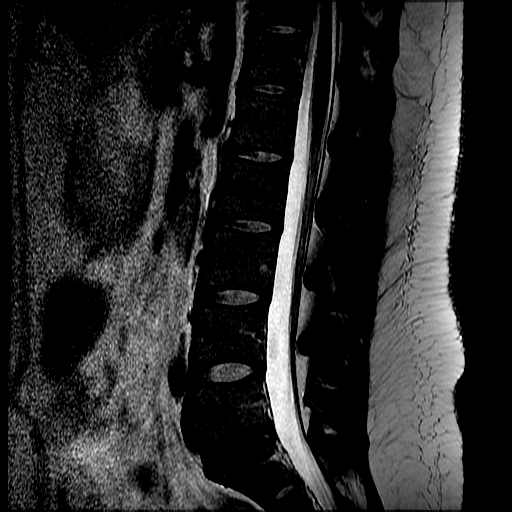
[im 10/15]
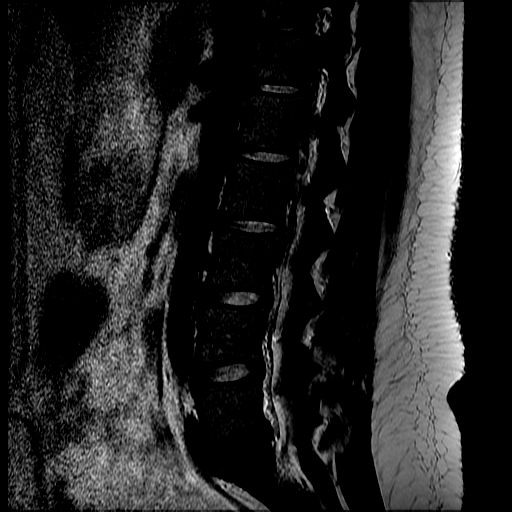
[im 12/15]
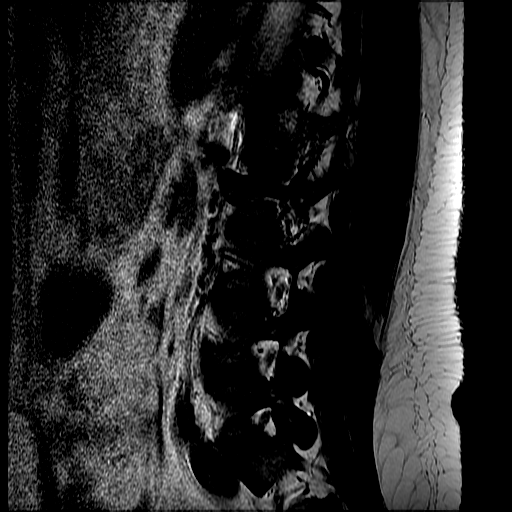
[im 15/15]
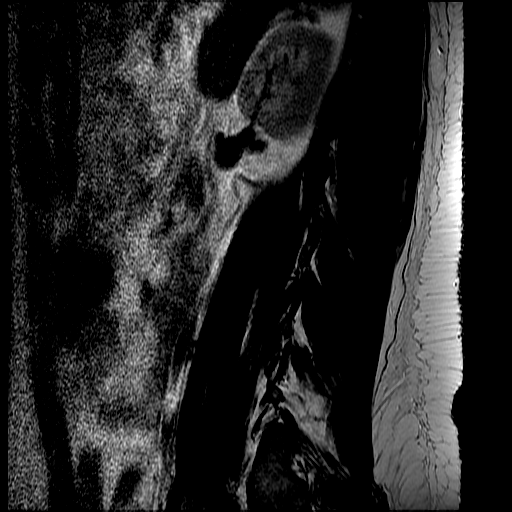

[Series 6: T2 · axial · 4.0mm · 0.39mm/px · z∈[-81,+69]mm · 6 of 33 slices shown (2 of 2)]
[im 1/33]
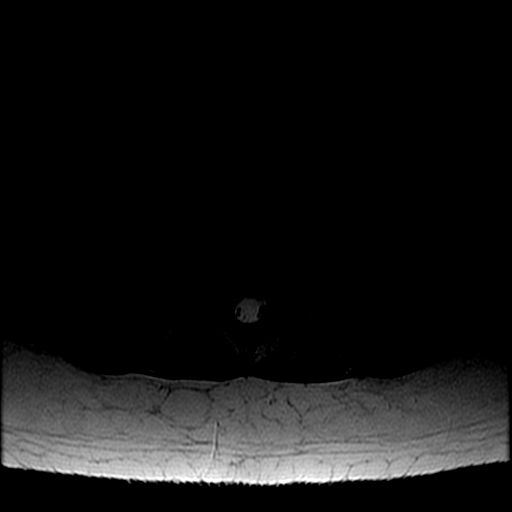
[im 5/33]
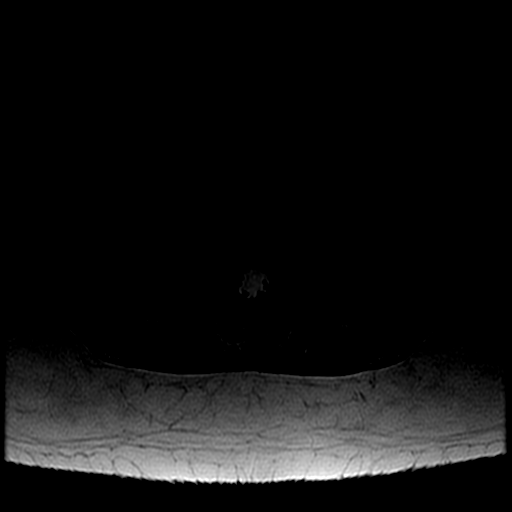
[im 10/33]
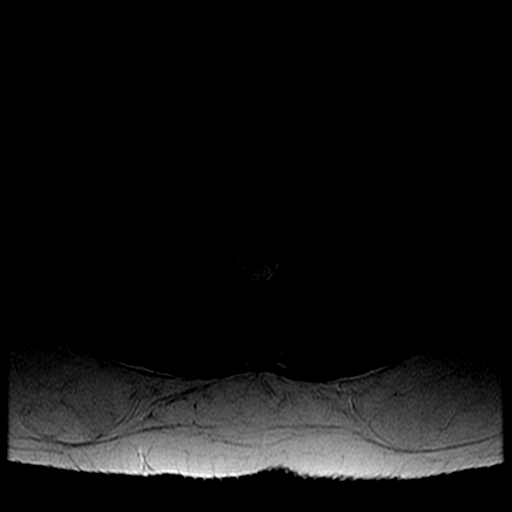
[im 15/33]
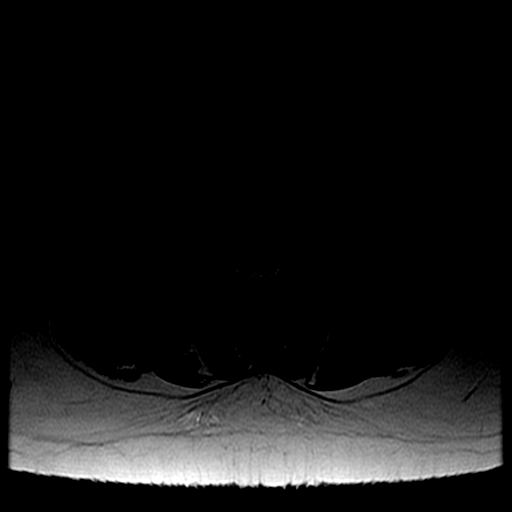
[im 18/33]
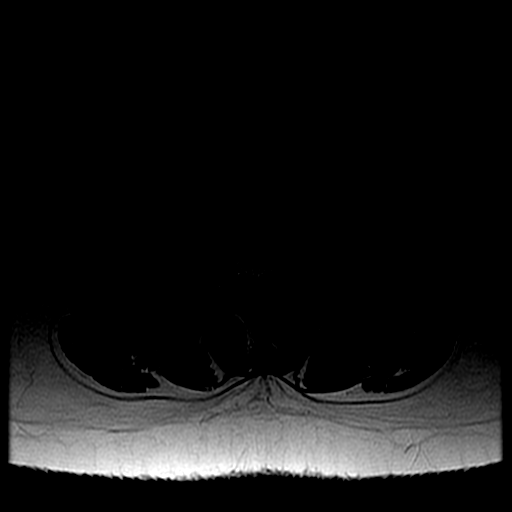
[im 28/33]
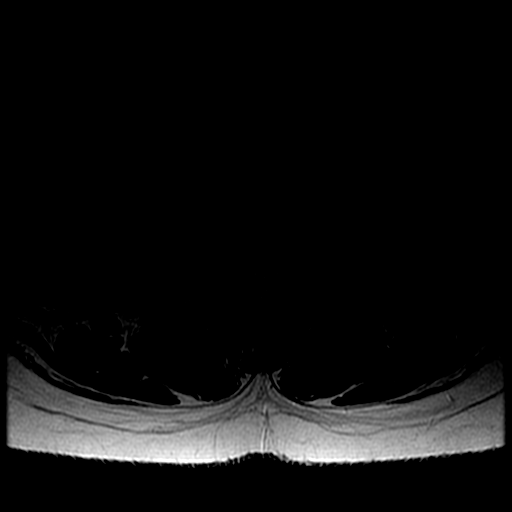

[Series 7: T1 · axial · 4.0mm · 0.39mm/px · z∈[-61,+69]mm · 3 of 33 slices shown (2 of 2)]
[im 5/33]
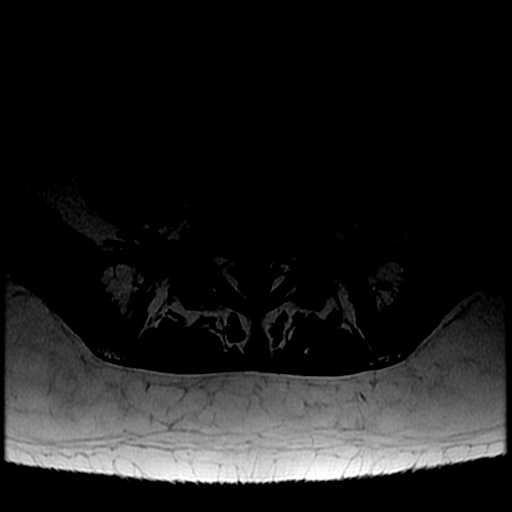
[im 18/33]
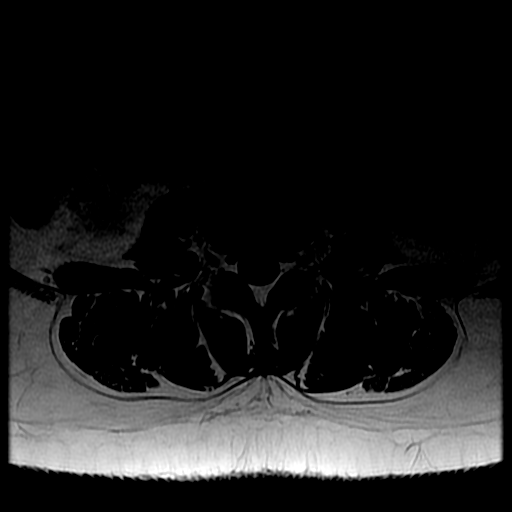
[im 28/33]
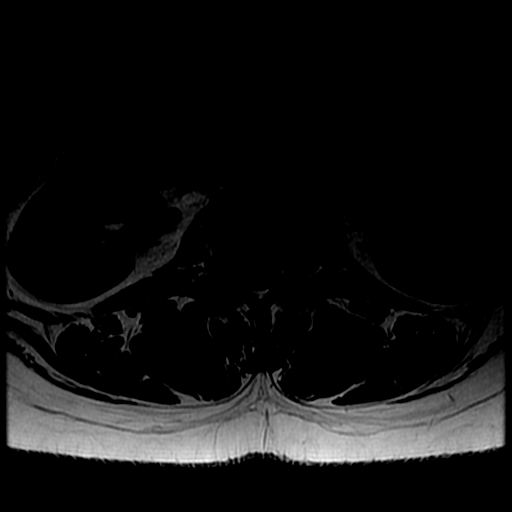

[19 of 48 positions shown; findings below may reference images not displayed]

FINDINGS: Segmentation:  Standard

Alignment:  Physiologic.

Vertebrae:  No fracture, evidence of discitis, or bone lesion.

Conus medullaris: Extends to the L1 level and appears normal.

Paraspinal and other soft tissues: Unremarkable

Disc levels:

L1-L2:  Normal disc space.  Mild facet arthropathy.  No impingement.

L2-L3:  Normal disc space.  Mild facet arthropathy.  No impingement.

L3-L4:  Normal disc space.  Mild facet arthropathy.  No impingement.

L4-L5:  Normal disc space.  Mild facet arthropathy.  No impingement.

L5-S1: Annular rent in the midline. Central protrusion. Mild facet
arthropathy. No impingement.

Normal-appearing SI joints throughout their upper visualized
segments.
IMPRESSION: Annular rent at L5-S1 the midline with central protrusion. No
impingement.

Mild facet arthropathy at multiple levels.

## 2017-03-31 ENCOUNTER — Encounter: Payer: Self-pay | Admitting: Physical Therapy

## 2017-03-31 ENCOUNTER — Ambulatory Visit: Payer: PRIVATE HEALTH INSURANCE | Attending: Family Medicine | Admitting: Physical Therapy

## 2017-03-31 DIAGNOSIS — M62830 Muscle spasm of back: Secondary | ICD-10-CM | POA: Insufficient documentation

## 2017-03-31 DIAGNOSIS — M5441 Lumbago with sciatica, right side: Secondary | ICD-10-CM | POA: Insufficient documentation

## 2017-03-31 DIAGNOSIS — M5442 Lumbago with sciatica, left side: Secondary | ICD-10-CM | POA: Insufficient documentation

## 2017-03-31 DIAGNOSIS — M6281 Muscle weakness (generalized): Secondary | ICD-10-CM | POA: Insufficient documentation

## 2017-03-31 NOTE — Therapy (Signed)
[facility] [address] [place], [place], [address] Phone: [phone]   Fax:  [phone]  Physical Therapy Evaluation  Patient Details  Name: Megan Shannon MRN: [id number] Date of Birth: 1996-01-11 Referring Provider: Lanell Persons MD  Encounter Date: 03/31/2017      PT End of Session - 03/31/17 1713    Visit Number 1   Number of Visits 6   Date for PT Re-Evaluation 05/12/17   Authorization Type Workers comp   PT Start Time 1631   PT Stop Time 1718   PT Time Calculation (min) 47 min   Activity Tolerance Patient tolerated treatment well   Behavior During Therapy WFL for tasks assessed/performed      Past Medical History:  Diagnosis Date  . Atypical chest pain   . Seasonal allergies     Past Surgical History:  Procedure Laterality Date  . NONE      There were no vitals filed for this visit.       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 reports pain started in the lo wback and is radiating in to the thighs reported as constant burning pain. worse with walking and standing.  pt denies saddle parethesia, or incontinence; but she reports weakness in both legs. no previous PHMx of low back pain.    Limitations Lifting   How long can you sit comfortably? 10-15 min   How long can you stand comfortably? 10-15 min   How long can you walk comfortably? 10-15 min   Diagnostic tests x-ray x 2, MRI   Patient Stated Goals to feel better, to avoid pain, return to work    Currently in Pain? Yes   Pain Score 4   at worst 8/10   Pain Location Back   Pain Orientation Lower   Pain Descriptors / Indicators Burning;Constant;Tightness   Pain Type Chronic pain   Pain Radiating Towards into  bil thighs   Pain Onset More than a month ago   Pain Frequency Constant   Aggravating Factors  alot of movement, bending forward   Pain Relieving Factors prednisone, heating pad,    Effect of Pain on Daily Activities limited endurance with CKC,             [facility] PT Assessment - 03/31/17 1631      Assessment   Medical Diagnosis Low back pain   Referring Provider Lanell Persons MD   Onset Date/Surgical Date --  03/07/2017   Hand Dominance Right   Next MD Visit --  04/08/2017   Prior Therapy no     Precautions   Precaution Comments no lifting over 10#, no bending/ twisting,     Restrictions   Weight Bearing Restrictions No     Balance Screen   Has the patient fallen in the past 6 months No   Has the patient had a decrease in activity level because of a fear of falling?  No   Is the patient reluctant to leave their home because of a fear of falling?  No     Home Environment   Living Environment Private residence   Living Arrangements Spouse/significant other   Available Help at Discharge Family   Type of Home  House   Home Access Stairs to enter   Entrance Stairs-Number of Steps 5   Entrance Stairs-Rails Can reach both   Home Layout One level   Home Equipment None     Prior Function   Level of Independence Independent;Independent with basic ADLs   Vocation Full time employment  CMA   Vocation Requirements lifting, carrying, squating, pushing/ pulling,    Leisure hanging out with friends, being with family     Cognition   Overall Cognitive Status Within Functional Limits for tasks assessed     Observation/Other Assessments   Focus on Therapeutic Outcomes (FOTO)   70% limited, predicted 44% limited     Posture/Postural Control   Posture/Postural Control Postural limitations   Postural Limitations Rounded Shoulders;Forward head;Decreased thoracic kyphosis     ROM / Strength   AROM / PROM / Strength AROM;Strength     AROM   AROM Assessment Site Lumbar    Lumbar Flexion 40  reproduced LE referral sx   Lumbar Extension 18  reported as left sided pinching   Lumbar - Right Side Bend 20   Lumbar - Left Side Bend 18  pinching sensation at end range     Strength   Strength Assessment Site Hip;Knee   Right/Left Hip Right;Left   Right Hip Flexion 4/5  reproduction of LLE pain   Right Hip Extension 4-/5   Right Hip ABduction 4/5   Right Hip ADduction 4-/5   Left Hip Flexion 4-/5   Left Hip Extension 4-/5   Left Hip ABduction 4-/5   Left Hip ADduction 4-/5   Right/Left Knee Right;Left   Right Knee Flexion 4+/5   Right Knee Extension 4+/5   Left Knee Flexion 4+/5   Left Knee Extension 4+/5     Flexibility   Soft Tissue Assessment /Muscle Length yes   Hamstrings R 72, L 25   pain down the LLE     Palpation   Spinal mobility hypomobility of L1-L5    Palpation comment TTP along bil lumbar paraspinasl L>R     Special Tests    Special Tests Lumbar   Lumbar Tests Straight Leg Raise;Prone Knee Bend Test     Straight Leg Raise   Findings Positive   Side  Right   Comment contralateral pain during LLE raise            Objective measurements completed on examination: See above findings.          [facility] Adult PT Treatment/Exercise - 03/31/17 1631      Lumbar Exercises: Stretches   Single Knee to Chest Stretch 2 reps;30 seconds   Lower Trunk Rotation --  1 x 10    Press Ups --  3 x 10 with 5 sec hold     Lumbar Exercises: Supine   Bent Knee Raise 10 reps;Other (comment)  x 2 sets with ADIM                PT Education - 03/31/17 1728    Education provided Yes   Education Details evaluation findings, POC, goals, HEP with form/ rationale, anatomy of disc mechanics   Person(s) Educated Patient   Methods Explanation;Verbal cues;Handout;Demonstration  using spine model   Comprehension Verbalized understanding;Verbal cues required          PT Short Term Goals - 03/31/17 1738      PT SHORT TERM GOAL #1    Title pt to be I with inital HEp    Time 2  Period Weeks   Status New   Target Date 04/14/17     PT SHORT TERM GOAL #2   Title pt to verbalize/ demo proper posture with lifting and carrying mechanics to prevent and reduce low back pain   Time 2   Period Weeks   Status New   Target Date 04/14/17           PT Long Term Goals - 03/31/17 1739      PT LONG TERM GOAL #1   Title pt to improve trunk flexion to >/= 70 degree and extension / bil sidebending to >/= 20 degrees with </= 1/10 pain for functional mobility required for work taks and ADLs    Time 6   Period Weeks   Status New   Target Date 05/05/17     PT LONG TERM GOAL #2   Title pt to increase global  bil LE strength to >/= 4+/5 to promote proper lifting and carrying mechanics    Time 6   Period Weeks   Status New   Target Date 05/12/17     PT LONG TERM GOAL #3   Title pt to be able to lift/ lower >/= 25# and push/pull >/=25# utilizing correct posture for work related tasks    Time 6   Period Weeks   Status New   Target Date 05/12/17     PT LONG TERM GOAL #4   Title increase FOTO to </= 44% limited to demo improvement in function   Time 6   Period Weeks   Status New   Target Date 05/12/17                Plan - 03/31/17 1729    Clinical Impression Statement pt presents to OPPT with CC of low back pain starting 03/07/2017 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 limited trunk mobility noted with referral of sx down bil thighs with flexion, and mild weakness noted in bil LE. special testing combined with pt subjective findings suggest high probability of disc pathology. pt reported centralization with extension biased treatment and decreased pain following todays session. she would benefit from physical therapy to decrease low back pain/ LE referral, improve trunk mobility, increase LE strength and return pt to PLOF by addressing the deficits listed.    Clinical  Presentation Stable   Clinical Decision Making Low   Rehab Potential Good   PT Frequency 1x / week   PT Duration --  5 weeks   PT Treatment/Interventions ADLs/Self Care Home Management;Cryotherapy;Electrical Stimulation;Iontophoresis 4mg /ml Dexamethasone;Ultrasound;Traction;Moist Heat;Therapeutic activities;Therapeutic exercise;Dry needling;Manual techniques;Taping;Patient/family education;Passive range of motion;Balance training;Neuromuscular re-education   PT Next Visit Plan review and update HEP, extension biased treatment and progress, core/ hip strengthening, posture and lifting mechanics   PT Home Exercise Plan prone press-ups, LTR, supine marching, single knee to chest   Consulted and Agree with Plan of Care Patient      Patient will benefit from skilled therapeutic intervention in order to improve the following deficits and impairments:  Pain, Improper body mechanics, Postural dysfunction, Decreased range of motion, Decreased endurance, Decreased activity tolerance, Decreased balance, Increased muscle spasms  Visit Diagnosis: Acute bilateral low back pain with bilateral sciatica - Plan: PT plan of care cert/re-cert  Muscle weakness (generalized) - Plan: PT plan of care cert/re-cert  Muscle spasm of back - Plan: PT plan of care cert/re-cert     Problem List Patient Active Problem List   Diagnosis Date Noted  .  Post term pregnancy over 40 weeks 12/26/2015  . Atypical chest pain 07/18/2015   Lulu RidingKristoffer Grayson White PT, DPT, LAT, ATC  03/31/17  5:56 PM      [facility] [address] [place], [place], [address] Phone: [phone]   Fax:  [phone]  Name: Megan Shannon MRN: [id number] Date of Birth: 11/09/1995

## 2017-04-05 ENCOUNTER — Ambulatory Visit (HOSPITAL_COMMUNITY): Payer: BLUE CROSS/BLUE SHIELD

## 2017-04-07 ENCOUNTER — Encounter (HOSPITAL_COMMUNITY): Payer: Self-pay | Admitting: *Deleted

## 2017-04-07 ENCOUNTER — Emergency Department (HOSPITAL_COMMUNITY)
Admission: EM | Admit: 2017-04-07 | Discharge: 2017-04-07 | Disposition: A | Discharge status: Home / Self Care | Payer: PRIVATE HEALTH INSURANCE | Attending: Emergency Medicine | Admitting: Emergency Medicine

## 2017-04-07 DIAGNOSIS — M545 Low back pain: Secondary | ICD-10-CM | POA: Diagnosis present

## 2017-04-07 DIAGNOSIS — Z79899 Other long term (current) drug therapy: Secondary | ICD-10-CM | POA: Diagnosis not present

## 2017-04-07 DIAGNOSIS — M5136 Other intervertebral disc degeneration, lumbar region: Secondary | ICD-10-CM | POA: Diagnosis not present

## 2017-04-07 DIAGNOSIS — R112 Nausea with vomiting, unspecified: Secondary | ICD-10-CM | POA: Insufficient documentation

## 2017-04-07 LAB — URINALYSIS, ROUTINE W REFLEX MICROSCOPIC
BILIRUBIN URINE: NEGATIVE
Glucose, UA: NEGATIVE mg/dL
Ketones, ur: 5 mg/dL — AB
LEUKOCYTES UA: NEGATIVE
Nitrite: NEGATIVE
Protein, ur: NEGATIVE mg/dL
Specific Gravity, Urine: 1.019 (ref 1.005–1.030)
pH: 5 (ref 5.0–8.0)

## 2017-04-07 LAB — PREGNANCY, URINE: PREG TEST UR: NEGATIVE

## 2017-04-07 MED ORDER — IBUPROFEN 800 MG PO TABS: 800.0000 mg | ORAL_TABLET | Freq: Once | ORAL | Status: DC

## 2017-04-07 MED ORDER — MORPHINE SULFATE (PF) 4 MG/ML IV SOLN
4.0000 mg | Freq: Once | INTRAVENOUS | Status: AC
  Administered 2017-04-07: 4 mg via INTRAVENOUS
  Filled 2017-04-07: qty 1

## 2017-04-07 MED ORDER — ONDANSETRON 4 MG PO TBDP: 4.0000 mg | ORAL_TABLET | Freq: Three times a day (TID) | ORAL | 0 refills | Status: DC | PRN

## 2017-04-07 MED ORDER — HYDROCODONE-ACETAMINOPHEN 5-325 MG PO TABS: 1.0000 | ORAL_TABLET | Freq: Four times a day (QID) | ORAL | 0 refills | Status: DC | PRN

## 2017-04-07 MED ORDER — IBUPROFEN 800 MG PO TABS: 800.0000 mg | ORAL_TABLET | Freq: Three times a day (TID) | ORAL | 0 refills | Status: DC | PRN

## 2017-04-07 MED ORDER — KETOROLAC TROMETHAMINE 30 MG/ML IJ SOLN
30.0000 mg | Freq: Once | INTRAMUSCULAR | Status: AC
  Administered 2017-04-07: 30 mg via INTRAVENOUS
  Filled 2017-04-07: qty 1

## 2017-04-07 MED ORDER — HYDROCODONE-ACETAMINOPHEN 5-325 MG PO TABS: 2.0000 | ORAL_TABLET | Freq: Once | ORAL | Status: DC

## 2017-04-07 MED ORDER — ONDANSETRON 4 MG PO TBDP: 4.0000 mg | ORAL_TABLET | Freq: Once | ORAL | Status: DC

## 2017-04-07 MED ORDER — ONDANSETRON HCL 4 MG/2ML IJ SOLN
4.0000 mg | Freq: Once | INTRAMUSCULAR | Status: AC
  Administered 2017-04-07: 4 mg via INTRAVENOUS
  Filled 2017-04-07: qty 2

## 2017-04-07 NOTE — ED Provider Notes (Addendum)
TIME SEEN: 5:18 AM  CHIEF COMPLAINT: Back pain  HPI: Patient is a 21 year old female with no significant past medical history who presents to the emergency department with lower back pain.  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  States pain is progressively gotten worse.  She has taken NSAIDs, intermittent Flexeril and has finished 2 prednisone tapers, last was finished on October 20.  States the steroids do intermittently help with her pain.  She has been seen by Dr. Yevette Edwards and has had an outpatient MRI of her lumbar spine.  Has follow-up with this physician on October 26 at 1 PM.  She states she came to the emergency department tonight because she could not control her pain.  She had brief episodes of sharp chest pain that she contributes to feeling like her pain was out of control and nausea and vomiting.  No dysuria or hematuria.  No vaginal bleeding or discharge.  She denies numbness, tingling or focal weakness.  States it hurts more when she moves her left leg.  No bowel or bladder incontinence.  No urinary retention.  No fever.  She is able to ambulate.  No history of back surgeries or epidural injections.  She is not diabetic or immunocompromised.   ROS: See HPI Constitutional: no fever  Eyes: no drainage  ENT: no runny nose   Cardiovascular:  no chest pain  Resp: no SOB  GI: no vomiting GU: no dysuria Integumentary: no rash  Allergy: no hives  Musculoskeletal: no leg swelling  Neurological: no slurred speech ROS otherwise negative  PAST MEDICAL HISTORY/PAST SURGICAL HISTORY:  Past Medical History:  Diagnosis Date  . Atypical chest pain   . Seasonal allergies     MEDICATIONS:  Prior to Admission medications   Medication Sig Start Date End Date Taking? Authorizing Provider  cyclobenzaprine (FLEXERIL) 5 MG tablet Take 5-10 mg by mouth 3 (three) times daily as needed for muscle spasms.   Yes [provider]  norgestimate-ethinyl estradiol (ORTHO-CYCLEN,SPRINTEC,PREVIFEM) 0.25-35 MG-MCG tablet Take 1 tablet by mouth daily.   Yes [provider]    ALLERGIES:  No Known Allergies  SOCIAL HISTORY:  Social History  Substance Use Topics  . Smoking status: Never Smoker  . Smokeless tobacco: Never Used  . Alcohol use No    FAMILY HISTORY: Family History  Problem Relation Age of Onset  . Diabetes Brother   . Thyroid nodules Brother   . Diabetes Maternal Grandmother     EXAM: BP 117/65 (BP Location: Right Arm)   Pulse 91   Temp 99.2 F (37.3 C) (Oral)   Resp 16   LMP 03/13/2017   SpO2 99%  CONSTITUTIONAL: Alert and oriented and responds appropriately to questions. Well-appearing; well-nourished HEAD: Normocephalic EYES: Conjunctivae clear, pupils appear equal, EOMI ENT: normal nose; moist mucous membranes NECK: Supple, no meningismus, no nuchal rigidity, no LAD  CARD: RRR; S1 and S2 appreciated; no murmurs, no clicks, no rubs, no gallops RESP: Normal chest excursion without splinting or tachypnea; breath sounds clear and equal bilaterally; no wheezes, no rhonchi, no rales, no hypoxia or respiratory distress, speaking full sentences ABD/GI: Normal bowel sounds; non-distended; soft, non-tender, no rebound, no guarding, no peritoneal signs, no hepatosplenomegaly BACK:  The back appears normal and is tender over the lower lumbar spine without step-off or deformity, there is no erythema, warmth, ecchymosis, swelling or other lesions appreciated, there is no CVA tenderness EXT: Normal  ROM in all joints; non-tender to palpation; no edema; normal capillary refill; no cyanosis, no calf tenderness or swelling    SKIN: Normal color for age and race; warm; no rash NEURO: Moves all extremities equally, strength 5/5 in all 4 extremities, positive straight leg raise on the left, sensation to light touch intact diffusely, no saddle anesthesia, 2+ deep tendon reflexes in bilateral  upper and lower extremities, no clonus, normal speech PSYCH: The patient's mood and manner are appropriate. Grooming and personal hygiene are appropriate.  MEDICAL DECISION MAKING: Patient here with complaints of back pain that is uncontrolled with prednisone, NSAIDs, Flexeril.  She has an appointment for follow-up with her orthopedic physician tomorrow.  She had an MRI October 17 that showed annular rent at L5-S1 with central protrusion with no impingement.  No symptoms of cauda equina today.  No infectious symptoms.  Doubt epidural abscess or hematoma, discitis, osteomyelitis.  I do not feel she needs repeat emergent imaging.  No new injury.  Will give Toradol, morphine, Zofran as she has an IV in place.  Urine obtained in triage is unremarkable.  Doubt kidney stone.  Pregnancy test negative.  EKG unremarkable.  Doubt ACS, PE or dissection.  No chest pain currently.  I do not feel this needs to be worked up further.  Will ambulate patient in the emergency department but anticipate discharge home with prescriptions of Vicodin, ibuprofen, Zofran and follow-up with her orthopedic physician as scheduled tomorrow.  Will provide with a work note for 1 day.  Patient and mother at bedside are comfortable with this plan.  We have discussed at length return precautions.   7:05 AM  Pt reports pain significantly improved.  Able to ambulate in the emergency department without difficulty.  Will discharge home with her mother.  At this time, I do not feel there is any life-threatening condition present. I have reviewed and discussed all results (EKG, imaging, lab, urine as appropriate) and exam findings with patient/family. I have reviewed nursing notes and appropriate previous records.  I feel the patient is safe to be discharged home without further emergent workup and can continue workup as an outpatient as needed. Discussed usual and customary return precautions. Patient/family verbalize understanding and are  comfortable with this plan.  Outpatient follow-up has been provided if needed. All questions have been answered.      EKG Interpretation  Date/Time:  Thursday April 07 2017 04:11:10 EDT Ventricular Rate:  102 PR Interval:  114 QRS Duration: 92 QT Interval:  324 QTC Calculation: 422 R Axis:   99 Text Interpretation:  Sinus tachycardia Rightward axis Borderline ECG No old tracing to compare Confirmed by Azalea Cedar, Baxter HireKristen [phone]([id number]) on 04/07/2017 4:23:40 AM         Deshay Kirstein, Layla MawKristen N, DO 04/07/17 0543    Seichi Kaufhold, Layla MawKristen N, DO 04/07/17 [id number]

## 2017-04-07 NOTE — ED Notes (Signed)
Pt ambulated down the hall with no assistance. Pt stated her pain is an 8 out of 10 and that she is limping on the left side due to pain. RN Reita ClicheBobby notified

## 2017-04-07 NOTE — ED Triage Notes (Addendum)
Pt reports having a back injury on 9/23; was given flexeril without relief. Pt reports she has been feeling dizzy with NV and chest pain since last night, but thinks it is related to the pain she is having in her back

## 2017-04-07 NOTE — Discharge Instructions (Signed)
Please follow-up with your orthopedic surgeon as scheduled.  You have a tear in the L5-S1 disc that is likely the cause of your pain with some central protrusion but no impingement.  If you develop symptoms of numbness or weakness in both legs, difficulty holding her bowel or bladder, difficulty emptying your bladder, please return to the emergency department immediately.

## 2017-04-08 MED FILL — IBUPROFEN 800 MG TABS: 800 | 30 days supply | Qty: 90 | Fill #0

## 2017-04-08 MED FILL — METAXALONE 800 MG TABLET: 800 | 30 days supply | Qty: 60 | Fill #0

## 2017-04-13 ENCOUNTER — Ambulatory Visit: Payer: PRIVATE HEALTH INSURANCE | Attending: Family Medicine | Admitting: Physical Therapy

## 2017-04-13 ENCOUNTER — Encounter: Payer: Self-pay | Admitting: Physical Therapy

## 2017-04-13 DIAGNOSIS — M5442 Lumbago with sciatica, left side: Secondary | ICD-10-CM | POA: Insufficient documentation

## 2017-04-13 DIAGNOSIS — M6281 Muscle weakness (generalized): Secondary | ICD-10-CM | POA: Insufficient documentation

## 2017-04-13 DIAGNOSIS — M5441 Lumbago with sciatica, right side: Secondary | ICD-10-CM | POA: Diagnosis present

## 2017-04-13 DIAGNOSIS — M62830 Muscle spasm of back: Secondary | ICD-10-CM | POA: Diagnosis present

## 2017-04-13 NOTE — Therapy (Addendum)
[place], [place], [address] Phone: [phone]   Fax:  [phone]  Physical Therapy Treatment / Discharge Summary  Patient Details  Name: Megan Shannon MRN: [id number] Date of Birth: 1995/11/05 Referring Provider: Odis Luster MD  Encounter Date: 04/13/2017      PT End of Session - 04/13/17 1623    Visit Number 2   Number of Visits 6   Date for PT Re-Evaluation 05/12/17   Authorization Type Workers comp   Authorization - Visit Number 2   Authorization - Number of Visits 6   PT Start Time 1622   PT Stop Time 1716   PT Time Calculation (min) 54 min   Activity Tolerance Patient tolerated treatment well   Behavior During Therapy [facility] for tasks assessed/performed      Past Medical History:  Diagnosis Date  . Atypical chest pain   . Seasonal allergies     Past Surgical History:  Procedure Laterality Date  . NONE      There were no vitals filed for this visit.      Subjective Assessment - 04/13/17 1617    Subjective "I went to the ED since the last session due to having more pain, and was given medication to calm down pain and muscle spasm. I took last thursday and Friday off from work and returned back on Monday"   Currently in Pain? Yes   Pain Score 2    Pain Location Back   Pain Onset More than a month ago   Pain Frequency Intermittent                         [facility] Adult PT Treatment/Exercise - 04/13/17 1719      Lumbar Exercises: Stretches   Active Hamstring Stretch 2 reps;30 seconds  bil   Single Knee to Chest Stretch 2 reps;30 seconds   Prone on Elbows Stretch --  2 x 15   Press Ups --  2 x 20     Lumbar Exercises: Aerobic   Stationary Bike Nu-Step L6 x 5 min     Lumbar Exercises: Supine   Other Supine Lumbar Exercises dead bug 4 x 10 sec hold  tactile cues for form and verbal cues for ADIM     Manual Therapy   Manual Therapy Soft tissue  mobilization;Myofascial release   Soft tissue mobilization IASTM over bil lumbar paraspinals   Myofascial Release fascial stretching/ rolling along bil lumbar paraspinals          Trigger Point Dry Needling - 04/13/17 1725    Consent Given? Yes   Education Handout Provided Yes   Muscles Treated Upper Body Longissimus   Longissimus Response Twitch response elicited;Palpable increased muscle length  L2 / L4 bil multifidi              PT Education - 04/13/17 4656    Education provided Yes   Education Details anatomy regarding muscle referral. what TPDN, benefits/ what to expect and after care. posture awareness and progressed HEP  before and during DN tx   Person(s) Educated Patient   Methods Explanation;Verbal cues;Handout   Comprehension Verbalized understanding;Verbal cues required          PT Short Term Goals - 03/31/17 1738      PT SHORT TERM GOAL #1   Title pt to be I with inital HEp    Time 2   Period Weeks   Status New  Target Date 04/14/17     PT SHORT TERM GOAL #2   Title pt to verbalize/ demo proper posture with lifting and carrying mechanics to prevent and reduce low back pain   Time 2   Period Weeks   Status New   Target Date 04/14/17           PT Long Term Goals - 03/31/17 1739      PT LONG TERM GOAL #1   Title pt to improve trunk flexion to >/= 70 degree and extension / bil sidebending to >/= 20 degrees with </= 1/10 pain for functional mobility required for work taks and ADLs    Time 6   Period Weeks   Status New   Target Date 05/05/17     PT LONG TERM GOAL #2   Title pt to increase global  bil LE strength to >/= 4+/5 to promote proper lifting and carrying mechanics    Time 6   Period Weeks   Status New   Target Date 05/12/17     PT LONG TERM GOAL #3   Title pt to be able to lift/ lower >/= 25# and push/pull >/=25# utilizing correct posture for work related tasks    Time 6   Period Weeks   Status New   Target Date 05/12/17      PT LONG TERM GOAL #4   Title increase FOTO to </= 44% limited to demo improvement in function   Time 6   Period Weeks   Status New   Target Date 05/12/17               Plan - 04/13/17 1722    Clinical Impression Statement pt reports having to go to the ED due to increased pain in the low back and feeling nauseas, she was given meds to calm down pain and spasm. she reported 3/10 pain today. educated and performed TPDN over bil lumbar L2 and L4 paraspinals followed with IASTM and fascial techinques. progress prone extension biased treatment to which she continued to demo improvement with. Post session she declined modalities and reported no pain.    PT Treatment/Interventions ADLs/Self Care Home Management;Cryotherapy;Electrical Stimulation;Iontophoresis 62m/ml Dexamethasone;Ultrasound;Traction;Moist Heat;Therapeutic activities;Therapeutic exercise;Dry needling;Manual techniques;Taping;Patient/family education;Passive range of motion;Balance training;Neuromuscular re-education   PT Next Visit Plan response to DN, update HEP, extension biased treatment and progress, core/ hip strengthening, posture and lifting mechanics,    PT Home Exercise Plan prone one elbows, LTR, supine marching, single knee to chest, prone press up, posture and lifting mechanics.   Consulted and Agree with Plan of Care Patient      Patient will benefit from skilled therapeutic intervention in order to improve the following deficits and impairments:  Pain, Improper body mechanics, Postural dysfunction, Decreased range of motion, Decreased endurance, Decreased activity tolerance, Decreased balance, Increased muscle spasms  Visit Diagnosis: Acute bilateral low back pain with bilateral sciatica  Muscle spasm of back  Muscle weakness (generalized)     Problem List Patient Active Problem List   Diagnosis Date Noted  . Post term pregnancy over 40 weeks 12/26/2015  . Atypical chest pain 07/18/2015    KStarr LakePT, DPT, LAT, ATC  04/13/17  5:26 PM      [facility] [place] [address]Phone: [phone]  Fax:  [phone] Name: Megan DotyMRN: [id number] of Birth: [birth year]      PHYSICAL THERAPY DISCHARGE SUMMARY  Visits from Start of  Care: 2  Current functional level related to goals / functional outcomes: See goals   Remaining deficits: Abnormal posture, abnormal lifting mechanics, pt was transferred via [facility] to another physical therapy clinic. But as of last attended visit she was doing well.    Education / Equipment: HEP, theraband, posture education,  Plan: Patient agrees to discharge.  Patient goals were not met. Patient is being discharged due to                                                     ?????  Being transferred to another PT clinic        [facility] PT, DPT, LAT, ATC  04/19/17  9:15 AM

## 2017-04-13 NOTE — Patient Instructions (Addendum)

## 2017-04-20 ENCOUNTER — Encounter: Payer: Self-pay | Admitting: Physical Therapy

## 2017-04-20 MED FILL — NORG-EE 0.18-0.215-0.25/0.0: 0.18/0.215/ | 28 days supply | Qty: 28 | Fill #12

## 2017-04-27 ENCOUNTER — Encounter: Payer: Self-pay | Admitting: Physical Therapy

## 2017-05-04 ENCOUNTER — Encounter: Payer: Self-pay | Admitting: Physical Therapy

## 2017-05-13 MED FILL — NORG-EE 0.18-0.215-0.25/0.0: 0.18/0.215/ | 84 days supply | Qty: 84 | Fill #0

## 2017-05-25 ENCOUNTER — Encounter: Payer: Self-pay | Admitting: Registered"

## 2017-05-25 ENCOUNTER — Encounter: Payer: BLUE CROSS/BLUE SHIELD | Attending: Obstetrics and Gynecology | Admitting: Registered"

## 2017-05-25 DIAGNOSIS — Z713 Dietary counseling and surveillance: Secondary | ICD-10-CM | POA: Diagnosis present

## 2017-05-25 DIAGNOSIS — R7303 Prediabetes: Secondary | ICD-10-CM

## 2017-05-25 NOTE — Patient Instructions (Addendum)
-   Aim to have at least 30 min/day of physical activity 3-4 days a week. Can continue with physical therapy routine, brisk walking, arm exercises, etc.   - Aim to eat a small breakfast in the mornings: make your own parfait.  - Make your own trail mix: cheerios, almonds, pretzels, and granola mix.   - Try not to add juice to fruit smoothies. Fruit, greek yogurt/protein powder, and water/ice.   - Aim snack on carrots, celery, trail mix, fruit with peanut butter.   - Aim to choose high fiber grains: brown rice, whole wheat flour, whole wheat bread.   - Check into seeing a mental health professional to help with recent life changing events.

## 2017-05-25 NOTE — Progress Notes (Signed)
  Medical Nutrition Therapy:  Appt start time: 4:00 end time: 4:45.   Assessment:  Primary concerns today: recent A1c value of 5.9.   Pt states she loves juice and all fruit in smoothies, not vegetables in smoothie. Pt states she skips breakfast sometimes because she is not hungry in the mornings. Pt states her father died unexpectedly Oct 2016 and shortly found out she was pregnant soon afterwards. Pt states she was stressed then and increased her eating. Pt states she is still stressed due to death of father. Pt states she has been drinking more water since seeing doctor a few months ago; drinks about 2 bottles a day.  Pt states she is not a meat person; prefers chicken. Pt states she loves carrots and has reduced soda intake recently.    Preferred Learning Style:   Visual  Learning Readiness:   Ready  Change in progress   MEDICATIONS: See list   DIETARY INTAKE:  Usual eating pattern includes 2-3 meals and 1-2 snacks per day.  Everyday foods include fruit, juice, sandwiches.  Avoided foods include milk.    24-hr recall:  B ( AM): granola, smoothie (all fruit)  Snk ( AM): none  L ( PM): rotisserie chicken, rice, soy sauce or fast food Snk ( PM): sometimes cookies D ( PM): tortilla, beans, rice or macaroni and cheese Snk ( PM): chips or popcorn Beverages: juice, soft drinks (vending machine), smoothies from [employer], water, lemonade  Usual physical activity: physical therapy 60 min, 2x/week (treadmill, strengthening exercises)  Estimated energy needs: 2000 calories 225 g carbohydrates 150 g protein 56 g fat  Progress Towards Goal(s):  In progress.   Nutritional Diagnosis:  NB-1.1 Food and nutrition-related knowledge deficit As related to lack of prior nutrition-related education.  As evidenced by pt report of no prior education provided on prediabetes and how to apply food and nutrition related information.    Intervention:  Nutrition education and counseling. Pt  was educated and counseled on ways to decrease sugar-sweetened beverage consumption, increase fiber intake, increase physical activity, increase non-starchy vegetable intake, aiming for 3 meals a day, and healthy snacking. Pt was also educated on prediabetes.  Goals: - Aim to have at least 30 min/day of physical activity 3-4 days a week. Can continue with physical therapy routine, brisk walking, arm exercises, etc.  - Aim to eat a small breakfast in the mornings: make your own parfait. - Make your own trail mix: cheerios, almonds, pretzels, and granola mix.  - Try not to add juice to fruit smoothies. Fruit, greek yogurt/protein powder, and water/ice.  - Aim snack on carrots, celery, trail mix, fruit with peanut butter.  - Aim to choose high fiber grains: brown rice, whole wheat flour, whole wheat bread.  - Check into seeing a mental health professional to help with recent life changing events.    Teaching Method Utilized:  Visual Auditory Hands on  Handouts given during visit include:  Arm exercsies  Planning healthy meals  Barriers to learning/adherence to lifestyle change: none  Demonstrated degree of understanding via:  Teach Back   Monitoring/Evaluation:  Dietary intake, exercise, and body weight prn.

## 2017-06-22 MED FILL — PROMETHAZINE W/COD SYRUP: 6.25-10 | 6 days supply | Qty: 120 | Fill #0

## 2017-08-15 MED FILL — NORG-EE 0.18-0.215-0.25/0.0: 0.18/0.215/ | 84 days supply | Qty: 84 | Fill #1

## 2017-09-20 MED FILL — MEDROXYPROGESTERONE 10 MG T: 10 | 10 days supply | Qty: 10 | Fill #0

## 2017-09-28 MED FILL — LEVONOR-ETH ESTRAD 0.1-0.02: 0.1-20 | 28 days supply | Qty: 28 | Fill #0

## 2017-10-31 MED FILL — LEVONOR-ETH ESTRAD 0.1-0.02: 0.1-20 | 84 days supply | Qty: 84 | Fill #1

## 2018-01-23 MED FILL — LEVONOR-ETH ESTRAD 0.1-0.02: 0.1-20 | 84 days supply | Qty: 84 | Fill #2

## 2018-04-02 ENCOUNTER — Other Ambulatory Visit (INDEPENDENT_AMBULATORY_CARE_PROVIDER_SITE_OTHER): Payer: Self-pay | Admitting: Orthopaedic Surgery

## 2018-04-02 MED ORDER — MELOXICAM 7.5 MG PO TABS: 15.0000 mg | ORAL_TABLET | Freq: Every day | ORAL | 2 refills | Status: DC | PRN

## 2018-04-02 MED ORDER — PREDNISONE 10 MG (21) PO TBPK: ORAL_TABLET | ORAL | 0 refills | Status: DC

## 2018-04-02 MED ORDER — TIZANIDINE HCL 4 MG PO TABS: 4.0000 mg | ORAL_TABLET | Freq: Four times a day (QID) | ORAL | 2 refills | Status: DC | PRN

## 2018-04-02 MED ORDER — METHOCARBAMOL 500 MG PO TABS: 500.0000 mg | ORAL_TABLET | Freq: Four times a day (QID) | ORAL | 2 refills | Status: DC | PRN

## 2018-04-27 ENCOUNTER — Other Ambulatory Visit (INDEPENDENT_AMBULATORY_CARE_PROVIDER_SITE_OTHER): Payer: Self-pay

## 2018-04-27 MED ORDER — PREDNISONE 10 MG (21) PO TBPK: ORAL_TABLET | ORAL | 0 refills | Status: DC

## 2018-04-28 ENCOUNTER — Other Ambulatory Visit: Payer: Self-pay | Admitting: Obstetrics and Gynecology

## 2018-04-28 ENCOUNTER — Other Ambulatory Visit (HOSPITAL_COMMUNITY)
Admission: RE | Admit: 2018-04-28 | Discharge: 2018-04-28 | Disposition: A | Discharge status: Home / Self Care | Payer: BLUE CROSS/BLUE SHIELD | Source: Ambulatory Visit | Attending: Obstetrics and Gynecology | Admitting: Obstetrics and Gynecology

## 2018-04-28 DIAGNOSIS — Z01411 Encounter for gynecological examination (general) (routine) with abnormal findings: Secondary | ICD-10-CM | POA: Diagnosis present

## 2018-05-03 LAB — CYTOLOGY - PAP: Diagnosis: NEGATIVE

## 2018-05-08 LAB — OB RESULTS CONSOLE RUBELLA ANTIBODY, IGM: Rubella: NON-IMMUNE/NOT IMMUNE

## 2018-05-08 LAB — OB RESULTS CONSOLE HEPATITIS B SURFACE ANTIGEN: Hepatitis B Surface Ag: NEGATIVE

## 2018-05-08 LAB — OB RESULTS CONSOLE RPR: RPR: NONREACTIVE

## 2018-05-08 LAB — OB RESULTS CONSOLE HIV ANTIBODY (ROUTINE TESTING): HIV: NONREACTIVE

## 2018-05-08 LAB — OB RESULTS CONSOLE ANTIBODY SCREEN: Antibody Screen: NEGATIVE

## 2018-05-08 LAB — OB RESULTS CONSOLE ABO/RH: RH Type: POSITIVE

## 2018-05-08 LAB — OB RESULTS CONSOLE GC/CHLAMYDIA
Chlamydia: NEGATIVE
Gonorrhea: NEGATIVE

## 2018-05-09 MED FILL — DOXYLAMINE-PYRIDOXINE 10-10: 10-10 | 25 days supply | Qty: 100 | Fill #0

## 2018-05-22 MED FILL — ONDANSETRON HCL 8 MG TABLET: 8 | 10 days supply | Qty: 30 | Fill #0

## 2018-06-14 NOTE — L&D Delivery Note (Signed)
Delivery Note At 10:30 AM a viable female, "Megan Shannon" was delivered via Vaginal, Spontaneous (Presentation: direct OA).  APGAR: 9, 9; weight pending  .   Placenta status: spontaneous, intact.  Cord:  with the following complications:None .  Cord pH: n/a  Anesthesia:  Epidural Episiotomy: None Lacerations: Vaginal (At the rim of the introitus on the vaginal side, only needed 2 stitches) Suture Repair: 3.0 chromic Est. Blood Loss (mL): 352  Mom to postpartum.  Baby to Couplet care / Skin to Skin.  Outpatient Circumcision desired.  Thurnell Lose 12/14/2018, 11:09 AM

## 2018-10-19 ENCOUNTER — Encounter: Payer: Self-pay | Admitting: Obstetrics and Gynecology

## 2018-10-23 ENCOUNTER — Encounter: Payer: Self-pay | Admitting: Obstetrics & Gynecology

## 2018-11-20 LAB — OB RESULTS CONSOLE GBS: GBS: NEGATIVE

## 2018-11-23 MED FILL — CYCLOBENZAPRINE 10 MG TAB: 10 | 5 days supply | Qty: 10 | Fill #0

## 2018-12-01 ENCOUNTER — Encounter (HOSPITAL_COMMUNITY): Payer: Self-pay | Admitting: *Deleted

## 2018-12-01 ENCOUNTER — Telehealth (HOSPITAL_COMMUNITY): Payer: Self-pay | Admitting: *Deleted

## 2018-12-01 NOTE — Telephone Encounter (Signed)
Preadmission screen  

## 2018-12-08 ENCOUNTER — Other Ambulatory Visit: Payer: Self-pay | Admitting: Family Medicine

## 2018-12-12 ENCOUNTER — Other Ambulatory Visit: Payer: Self-pay

## 2018-12-12 ENCOUNTER — Other Ambulatory Visit (HOSPITAL_COMMUNITY)
Admission: RE | Admit: 2018-12-12 | Discharge: 2018-12-12 | Disposition: A | Discharge status: Home / Self Care | Payer: Medicaid Other | Source: Ambulatory Visit | Attending: Obstetrics and Gynecology | Admitting: Obstetrics and Gynecology

## 2018-12-12 DIAGNOSIS — Z1159 Encounter for screening for other viral diseases: Secondary | ICD-10-CM | POA: Diagnosis present

## 2018-12-12 LAB — SARS CORONAVIRUS 2 (TAT 6-24 HRS): SARS Coronavirus 2: NEGATIVE

## 2018-12-12 NOTE — MAU Note (Signed)
Swab collected without difficulty. 

## 2018-12-13 NOTE — H&P (Signed)
Megan Shannon is a 23 y.o. female G3 P1011 @ 39 2/7 weeks presenting for IOL.  Pt has a h/o SVD of a 10 pound infant at 41 weeks,  with which she had a 3rd degree laceration.  Pt has a gaping introitus w/ short perineal body.  She has been evaluated by pelvic floor specialist, surgery recommended after childbearing completed.  Pt was offered a cesarean section but declined.    [facility]  Pt transferred care after 27 weeks and resumed care at 36 weeks.  It has been complicated by Gestational Thrombocytopenia. The lowest platelets have been were 115.  OB History    Gravida  2   Para  1   Term  1   Preterm      AB      Living  1     SAB      TAB      Ectopic      Multiple  0   Live Births  1          Past Medical History:  Diagnosis Date  . Atypical chest pain   . Seasonal allergies    Past Surgical History:  Procedure Laterality Date  . NONE     Family History: family history includes Diabetes in her maternal grandmother; Thyroid nodules in her brother. Social History:  reports that she has never smoked. She has never used smokeless tobacco. She reports that she does not drink alcohol or use drugs.     Maternal Diabetes: No Genetic Screening: Declined Maternal Ultrasounds/Referrals: Normal Fetal Ultrasounds or other Referrals:  None Maternal Substance Abuse:  No Significant Maternal Medications:  None Significant Maternal Lab Results:  Other: Gestational Thrombocytopenia. Other Comments:  None  ROS Maternal Medical History:  Contractions: Frequency: irregular.   Perceived severity is mild.    Fetal activity: Perceived fetal activity is normal.    Prenatal complications: Thrombocytopenia.   Prenatal Complications - Diabetes: none.      unknown if currently breastfeeding. Maternal Exam:  Abdomen: Fundal height is 39.   Estimated fetal weight is 6 lb 10 oz, 59% on 10/28/18.   Fetal presentation:  vertex  Pelvis: adequate for delivery.   Cervix: Cervix evaluated by digital exam.   Closed/-2, soft per Dr. Nelda Shannon on 12/11/18.  Fetal Exam Fetal Monitor Review: Mode: hand-held doppler probe.   Baseline rate: 135.      Physical Exam  Constitutional: She appears well-developed. No distress.    Prenatal labs: ABO, Rh: A/Positive/-- (11/25 0000) Antibody: Negative (11/25 0000) Rubella: Nonimmune (11/25 0000) RPR: Nonreactive (11/25 0000)  HBsAg: Negative (11/25 0000)  HIV: Non-reactive (11/25 0000)  GBS:   Negative  Assessment/Plan: IUP @ 39 2/7 weeks IOL with Cytotec. Gestational Thrombocytopenia. H/o Macrosomia.  EFW currently average. Anticipate SVD.    Megan Shannon 12/13/2018, 10:36 PM

## 2018-12-14 ENCOUNTER — Encounter (HOSPITAL_COMMUNITY): Payer: Self-pay

## 2018-12-14 ENCOUNTER — Inpatient Hospital Stay (HOSPITAL_COMMUNITY): Payer: Medicaid Other | Admitting: Anesthesiology

## 2018-12-14 ENCOUNTER — Inpatient Hospital Stay (HOSPITAL_COMMUNITY)
Admission: AD | Admit: 2018-12-14 | Discharge: 2018-12-15 | DRG: 807 | Disposition: A | Discharge status: Home / Self Care | Payer: Medicaid Other | Attending: Obstetrics and Gynecology | Admitting: Obstetrics and Gynecology

## 2018-12-14 ENCOUNTER — Inpatient Hospital Stay (HOSPITAL_COMMUNITY): Payer: Medicaid Other

## 2018-12-14 ENCOUNTER — Other Ambulatory Visit: Payer: Self-pay

## 2018-12-14 DIAGNOSIS — Z3A39 39 weeks gestation of pregnancy: Secondary | ICD-10-CM | POA: Diagnosis not present

## 2018-12-14 DIAGNOSIS — O26893 Other specified pregnancy related conditions, third trimester: Secondary | ICD-10-CM | POA: Diagnosis present

## 2018-12-14 DIAGNOSIS — O9912 Other diseases of the blood and blood-forming organs and certain disorders involving the immune mechanism complicating childbirth: Secondary | ICD-10-CM | POA: Diagnosis present

## 2018-12-14 DIAGNOSIS — D6959 Other secondary thrombocytopenia: Secondary | ICD-10-CM | POA: Diagnosis present

## 2018-12-14 DIAGNOSIS — Z3493 Encounter for supervision of normal pregnancy, unspecified, third trimester: Secondary | ICD-10-CM

## 2018-12-14 LAB — CBC
HCT: 33.5 % — ABNORMAL LOW (ref 36.0–46.0)
HCT: 32.9 % — ABNORMAL LOW (ref 36.0–46.0)
Hemoglobin: 10.7 g/dL — ABNORMAL LOW (ref 12.0–15.0)
Hemoglobin: 10.2 g/dL — ABNORMAL LOW (ref 12.0–15.0)
MCH: 26.8 pg (ref 26.0–34.0)
MCH: 26.4 pg (ref 26.0–34.0)
MCHC: 31.9 g/dL (ref 30.0–36.0)
MCHC: 31 g/dL (ref 30.0–36.0)
MCV: 83.8 fL (ref 80.0–100.0)
MCV: 85 fL (ref 80.0–100.0)
Platelets: 121 10*3/uL — ABNORMAL LOW (ref 150–400)
Platelets: 112 10*3/uL — ABNORMAL LOW (ref 150–400)
RBC: 4 MIL/uL (ref 3.87–5.11)
RBC: 3.87 MIL/uL (ref 3.87–5.11)
RDW: 14.7 % (ref 11.5–15.5)
RDW: 14.9 % (ref 11.5–15.5)
WBC: 7.8 10*3/uL (ref 4.0–10.5)
WBC: 8.8 10*3/uL (ref 4.0–10.5)
nRBC: 0 % (ref 0.0–0.2)
nRBC: 0 % (ref 0.0–0.2)

## 2018-12-14 LAB — RPR: RPR Ser Ql: NONREACTIVE

## 2018-12-14 LAB — ABO/RH: ABO/RH(D): A POS

## 2018-12-14 LAB — TYPE AND SCREEN
ABO/RH(D): A POS
Antibody Screen: NEGATIVE

## 2018-12-14 MED ORDER — MISOPROSTOL 25 MCG QUARTER TABLET
25.0000 ug | ORAL_TABLET | ORAL | Status: DC | PRN
  Administered 2018-12-14: 25 ug via VAGINAL
  Filled 2018-12-14 (×2): qty 1

## 2018-12-14 MED ORDER — EPHEDRINE 5 MG/ML INJ: 10.0000 mg | INTRAVENOUS | Status: DC | PRN

## 2018-12-14 MED ORDER — OXYCODONE HCL 5 MG PO TABS: 5.0000 mg | ORAL_TABLET | ORAL | Status: DC | PRN

## 2018-12-14 MED ORDER — PRENATAL MULTIVITAMIN CH
1.0000 | ORAL_TABLET | Freq: Every day | ORAL | Status: DC
  Administered 2018-12-15: 1 via ORAL
  Filled 2018-12-14: qty 1

## 2018-12-14 MED ORDER — ONDANSETRON HCL 4 MG PO TABS: 4.0000 mg | ORAL_TABLET | ORAL | Status: DC | PRN

## 2018-12-14 MED ORDER — BENZOCAINE-MENTHOL 20-0.5 % EX AERO: 1.0000 "application " | INHALATION_SPRAY | CUTANEOUS | Status: DC | PRN

## 2018-12-14 MED ORDER — LACTATED RINGERS IV SOLN
500.0000 mL | INTRAVENOUS | Status: DC | PRN
  Administered 2018-12-14: 500 mL via INTRAVENOUS

## 2018-12-14 MED ORDER — OXYTOCIN 40 UNITS IN NORMAL SALINE INFUSION - SIMPLE MED
1.0000 m[IU]/min | INTRAVENOUS | Status: DC
  Administered 2018-12-14: 2 m[IU]/min via INTRAVENOUS
  Filled 2018-12-14: qty 1000

## 2018-12-14 MED ORDER — OXYTOCIN 40 UNITS IN NORMAL SALINE INFUSION - SIMPLE MED
2.5000 [IU]/h | INTRAVENOUS | Status: DC
  Filled 2018-12-14: qty 1000

## 2018-12-14 MED ORDER — LACTATED RINGERS IV SOLN
INTRAVENOUS | Status: DC
  Administered 2018-12-14 (×3): via INTRAVENOUS

## 2018-12-14 MED ORDER — COCONUT OIL OIL: 1.0000 "application " | TOPICAL_OIL | Status: DC | PRN

## 2018-12-14 MED ORDER — WITCH HAZEL-GLYCERIN EX PADS: 1.0000 "application " | MEDICATED_PAD | CUTANEOUS | Status: DC | PRN

## 2018-12-14 MED ORDER — ZOLPIDEM TARTRATE 5 MG PO TABS: 5.0000 mg | ORAL_TABLET | Freq: Every evening | ORAL | Status: DC | PRN

## 2018-12-14 MED ORDER — ONDANSETRON HCL 4 MG/2ML IJ SOLN: 4.0000 mg | INTRAMUSCULAR | Status: DC | PRN

## 2018-12-14 MED ORDER — LIDOCAINE HCL (PF) 1 % IJ SOLN
INTRAMUSCULAR | Status: DC | PRN
  Administered 2018-12-14: 3 mL via EPIDURAL
  Administered 2018-12-14: 2 mL via EPIDURAL
  Administered 2018-12-14: 5 mL via EPIDURAL

## 2018-12-14 MED ORDER — OXYCODONE-ACETAMINOPHEN 5-325 MG PO TABS: 2.0000 | ORAL_TABLET | ORAL | Status: DC | PRN

## 2018-12-14 MED ORDER — DIPHENHYDRAMINE HCL 50 MG/ML IJ SOLN: 12.5000 mg | INTRAMUSCULAR | Status: DC | PRN

## 2018-12-14 MED ORDER — PHENYLEPHRINE 40 MCG/ML (10ML) SYRINGE FOR IV PUSH (FOR BLOOD PRESSURE SUPPORT)
80.0000 ug | PREFILLED_SYRINGE | INTRAVENOUS | Status: DC | PRN
  Filled 2018-12-14: qty 10

## 2018-12-14 MED ORDER — LACTATED RINGERS IV SOLN
500.0000 mL | Freq: Once | INTRAVENOUS | Status: AC
  Administered 2018-12-14: 1000 mL via INTRAVENOUS

## 2018-12-14 MED ORDER — SENNOSIDES-DOCUSATE SODIUM 8.6-50 MG PO TABS
2.0000 | ORAL_TABLET | ORAL | Status: DC
  Administered 2018-12-14: 2 via ORAL
  Filled 2018-12-14: qty 2

## 2018-12-14 MED ORDER — SODIUM CHLORIDE (PF) 0.9 % IJ SOLN
INTRAMUSCULAR | Status: DC | PRN
  Administered 2018-12-14: 12 mL/h via EPIDURAL

## 2018-12-14 MED ORDER — METHYLERGONOVINE MALEATE 0.2 MG PO TABS: 0.2000 mg | ORAL_TABLET | ORAL | Status: DC | PRN

## 2018-12-14 MED ORDER — OXYCODONE HCL 5 MG PO TABS: 10.0000 mg | ORAL_TABLET | ORAL | Status: DC | PRN

## 2018-12-14 MED ORDER — OXYCODONE-ACETAMINOPHEN 5-325 MG PO TABS: 1.0000 | ORAL_TABLET | ORAL | Status: DC | PRN

## 2018-12-14 MED ORDER — TETANUS-DIPHTH-ACELL PERTUSSIS 5-2.5-18.5 LF-MCG/0.5 IM SUSP: 0.5000 mL | Freq: Once | INTRAMUSCULAR | Status: DC

## 2018-12-14 MED ORDER — FLEET ENEMA 7-19 GM/118ML RE ENEM: 1.0000 | ENEMA | RECTAL | Status: DC | PRN

## 2018-12-14 MED ORDER — PHENYLEPHRINE 40 MCG/ML (10ML) SYRINGE FOR IV PUSH (FOR BLOOD PRESSURE SUPPORT)
80.0000 ug | PREFILLED_SYRINGE | INTRAVENOUS | Status: DC | PRN
  Administered 2018-12-14: 80 ug via INTRAVENOUS

## 2018-12-14 MED ORDER — SOD CITRATE-CITRIC ACID 500-334 MG/5ML PO SOLN: 30.0000 mL | ORAL | Status: DC | PRN

## 2018-12-14 MED ORDER — LIDOCAINE HCL (PF) 1 % IJ SOLN: 30.0000 mL | INTRAMUSCULAR | Status: DC | PRN

## 2018-12-14 MED ORDER — MAGNESIUM HYDROXIDE 400 MG/5ML PO SUSP: 30.0000 mL | ORAL | Status: DC | PRN

## 2018-12-14 MED ORDER — METHYLERGONOVINE MALEATE 0.2 MG/ML IJ SOLN: 0.2000 mg | INTRAMUSCULAR | Status: DC | PRN

## 2018-12-14 MED ORDER — SIMETHICONE 80 MG PO CHEW: 80.0000 mg | CHEWABLE_TABLET | ORAL | Status: DC | PRN

## 2018-12-14 MED ORDER — DIBUCAINE (PERIANAL) 1 % EX OINT: 1.0000 "application " | TOPICAL_OINTMENT | CUTANEOUS | Status: DC | PRN

## 2018-12-14 MED ORDER — ACETAMINOPHEN 325 MG PO TABS: 650.0000 mg | ORAL_TABLET | ORAL | Status: DC | PRN

## 2018-12-14 MED ORDER — BUTORPHANOL TARTRATE 1 MG/ML IJ SOLN: 1.0000 mg | INTRAMUSCULAR | Status: DC | PRN

## 2018-12-14 MED ORDER — DIPHENHYDRAMINE HCL 25 MG PO CAPS: 25.0000 mg | ORAL_CAPSULE | Freq: Four times a day (QID) | ORAL | Status: DC | PRN

## 2018-12-14 MED ORDER — ONDANSETRON HCL 4 MG/2ML IJ SOLN: 4.0000 mg | Freq: Four times a day (QID) | INTRAMUSCULAR | Status: DC | PRN

## 2018-12-14 MED ORDER — TERBUTALINE SULFATE 1 MG/ML IJ SOLN: 0.2500 mg | Freq: Once | INTRAMUSCULAR | Status: DC | PRN

## 2018-12-14 MED ORDER — FENTANYL-BUPIVACAINE-NACL 0.5-0.125-0.9 MG/250ML-% EP SOLN
12.0000 mL/h | EPIDURAL | Status: DC | PRN
  Filled 2018-12-14: qty 250

## 2018-12-14 MED ORDER — HYDROXYZINE HCL 50 MG PO TABS: 50.0000 mg | ORAL_TABLET | Freq: Four times a day (QID) | ORAL | Status: DC | PRN

## 2018-12-14 MED ORDER — OXYTOCIN BOLUS FROM INFUSION
500.0000 mL | Freq: Once | INTRAVENOUS | Status: AC
  Administered 2018-12-14: 500 mL via INTRAVENOUS

## 2018-12-14 MED ORDER — IBUPROFEN 600 MG PO TABS
600.0000 mg | ORAL_TABLET | Freq: Four times a day (QID) | ORAL | Status: DC
  Administered 2018-12-14 – 2018-12-15 (×4): 600 mg via ORAL
  Filled 2018-12-14 (×4): qty 1

## 2018-12-14 NOTE — Anesthesia Postprocedure Evaluation (Signed)
Anesthesia Post Note  Patient: Megan Shannon  Procedure(s) Performed: AN AD HOC LABOR EPIDURAL     Patient location during evaluation: Mother Baby Anesthesia Type: Epidural Level of consciousness: awake and alert Pain management: pain level controlled Vital Signs Assessment: post-procedure vital signs reviewed and stable Respiratory status: spontaneous breathing, nonlabored ventilation and respiratory function stable Cardiovascular status: stable Postop Assessment: no headache, no backache, epidural receding, no apparent nausea or vomiting, patient able to bend at knees, adequate PO intake and able to ambulate Anesthetic complications: no    Last Vitals:  Vitals:   12/14/18 1232 12/14/18 1400  BP: 109/64 112/68  Pulse: 68 71  Resp: 16 17  Temp: 37.1 C 36.7 C  SpO2: 100%     Last Pain:  Vitals:   12/14/18 1400  TempSrc: Oral  PainSc: 0-No pain   Pain Goal:                   [employer]

## 2018-12-14 NOTE — Lactation Note (Signed)
This note was copied from a baby's chart. Lactation Consultation Note  Patient Name: Megan Shannon RUEAV'W Date: 12/14/2018 Reason for consult: Initial assessment;Term  60 - 33 - I visited Megan Shannon to offer assistance with breast feeding. Baby Ezekiel latched at delivery and then fed at 1100, 1338, and 1728. We discussed day one infant feeding patterns, and I recommended that she feed 8-12 times a day on demand, waking to feed as needed.  Megan Shannon has a small red area/compression stripe on her left nipple. Her nipples are evert but not particularly elastic. They flatten slightly with compression.   I educated Megan Shannon on output expectations and feeding on cue. Baby began to wake, and we practiced positioning at the left breast first in cross cradle and then in football hold. Baby latched briefly in football hold and then fell asleep. I showed mom how to safely remove baby from the breast if it is painful.  I demonstrated hand expression, and mom was able to repeat back well.  Megan Shannon has a Medela DEBP at home. She has [place] with [facility]. She needed a nipple shield initially with her first child (now 3), and she was able to wean baby from it.  Maternal Data Formula Feeding for Exclusion: No Has patient been taught Hand Expression?: Yes Does the patient have breastfeeding experience prior to this delivery?: Yes  Feeding Feeding Type: Breast Fed  LATCH Score Latch: Repeated attempts needed to sustain latch, nipple held in mouth throughout feeding, stimulation needed to elicit sucking reflex.  Audible Swallowing: A few with stimulation  Type of Nipple: Everted at rest and after stimulation  Comfort (Breast/Nipple): Filling, red/small blisters or bruises, mild/mod discomfort  Hold (Positioning): Assistance needed to correctly position infant at breast and maintain latch.  LATCH Score: 6  Interventions Interventions: Breast feeding basics  reviewed;Assisted with latch;Hand express;Breast compression;Adjust position;Support pillows;Position options  Lactation Tools Discussed/Used WIC Program: Yes   Consult Status Consult Status: Follow-up Date: 12/15/18 Follow-up type: In-patient    Lenore Manner 12/14/2018, 7:15 PM

## 2018-12-14 NOTE — Anesthesia Procedure Notes (Signed)
Epidural Patient location during procedure: OB Start time: 12/14/2018 8:00 AM End time: 12/14/2018 8:07 AM  Staffing Anesthesiologist: Suzette Battiest, MD Performed: anesthesiologist   Preanesthetic Checklist Completed: patient identified, site marked, surgical consent, pre-op evaluation, timeout performed, IV checked, risks and benefits discussed and monitors and equipment checked  Epidural Patient position: sitting Prep: site prepped and draped and DuraPrep Patient monitoring: continuous pulse ox and blood pressure Approach: midline Location: L4-L5 Injection technique: LOR air  Needle:  Needle type: Tuohy  Needle gauge: 17 G Needle length: 9 cm and 9 Needle insertion depth: 5 cm cm Catheter type: closed end flexible Catheter size: 19 Gauge Catheter at skin depth: 10 cm Test dose: negative  Assessment Events: blood not aspirated, injection not painful, no injection resistance, negative IV test and no paresthesia

## 2018-12-14 NOTE — Anesthesia Preprocedure Evaluation (Signed)
Anesthesia Evaluation  Patient identified by MRN, date of birth, ID band Patient awake    Reviewed: Allergy & Precautions, Patient's Chart, lab work & pertinent test results  Airway Mallampati: II  TM Distance: >3 FB     Dental   Pulmonary neg pulmonary ROS,    breath sounds clear to auscultation       Cardiovascular negative cardio ROS   Rhythm:Regular Rate:Normal     Neuro/Psych negative neurological ROS     GI/Hepatic negative GI ROS, Neg liver ROS,   Endo/Other  negative endocrine ROS  Renal/GU negative Renal ROS     Musculoskeletal   Abdominal   Peds  Hematology  (+) Blood dyscrasia (thrombocytopenia), ,   Anesthesia Other Findings   Reproductive/Obstetrics (+) Pregnancy                             Lab Results  Component Value Date   WBC 7.8 12/14/2018   HGB 10.7 (L) 12/14/2018   HCT 33.5 (L) 12/14/2018   MCV 83.8 12/14/2018   PLT 121 (L) 12/14/2018    Anesthesia Physical Anesthesia Plan  ASA: II  Anesthesia Plan: Epidural   Post-op Pain Management:    Induction:   PONV Risk Score and Plan: Treatment may vary due to age or medical condition  Airway Management Planned: Natural Airway  Additional Equipment:   Intra-op Plan:   Post-operative Plan:   Informed Consent: I have reviewed the patients History and Physical, chart, labs and discussed the procedure including the risks, benefits and alternatives for the proposed anesthesia with the patient or authorized representative who has indicated his/her understanding and acceptance.       Plan Discussed with:   Anesthesia Plan Comments:         Anesthesia Quick Evaluation

## 2018-12-14 NOTE — Progress Notes (Signed)
Megan Shannon is a 23 y.o. G2P1001 at [redacted]w[redacted]d admitted for induction of labor due to h/o macrosomia with 3rd degree laceration.  Subjective: P{ comfortable s/p epidural.  Objective: BP 107/71   Pulse 73   Temp 99.4 F (37.4 C) (Oral)   Resp 18   Ht 5\' 5"  (1.651 m)   Wt 93.3 kg   SpO2 99%   BMI 34.21 kg/m  No intake/output data recorded. No intake/output data recorded.  FHT:  FHR: 130s bpm, variability: moderate,  accelerations:  Present,  decelerations:  Absent UC:   regular, every 2-4 minutes SVE:   Dilation: 5 Effacement (%): 60 Station: -2 Exam by:: Megan Shannon 4-5/60/0 station with a contraction Pitocin 8 mUs AROM @ 9:25, clear  Labs: Lab Results  Component Value Date   WBC 7.8 12/14/2018   HGB 10.7 (L) 12/14/2018   HCT 33.5 (L) 12/14/2018   MCV 83.8 12/14/2018   PLT 121 (L) 12/14/2018    Assessment / Plan: Induction of labor due to h/o macrosomia and 3rd degree laceration,  progressing well on pitocin  Labor: Progressing normally and S/p AROM. Continue Pitocin. Preeclampsia:  BP normal Fetal Wellbeing:  Category I Pain Control:  Epidural I/D:  GBS neg Anticipated MOD:  NSVD  Megan Shannon 12/14/2018, 9:36 AM

## 2018-12-15 LAB — CBC
HCT: 27.8 % — ABNORMAL LOW (ref 36.0–46.0)
Hemoglobin: 8.8 g/dL — ABNORMAL LOW (ref 12.0–15.0)
MCH: 26.9 pg (ref 26.0–34.0)
MCHC: 31.7 g/dL (ref 30.0–36.0)
MCV: 85 fL (ref 80.0–100.0)
Platelets: 115 10*3/uL — ABNORMAL LOW (ref 150–400)
RBC: 3.27 MIL/uL — ABNORMAL LOW (ref 3.87–5.11)
RDW: 14.8 % (ref 11.5–15.5)
WBC: 9.4 10*3/uL (ref 4.0–10.5)
nRBC: 0 % (ref 0.0–0.2)

## 2018-12-15 MED ORDER — FERROUS SULFATE 325 (65 FE) MG PO TABS: 325.0000 mg | ORAL_TABLET | Freq: Every day | ORAL | Status: DC

## 2018-12-15 MED ORDER — MEASLES, MUMPS & RUBELLA VAC IJ SOLR
0.5000 mL | Freq: Once | INTRAMUSCULAR | Status: AC
  Administered 2018-12-15: 0.5 mL via SUBCUTANEOUS
  Filled 2018-12-15: qty 0.5

## 2018-12-15 MED ORDER — IBUPROFEN 600 MG PO TABS: 600.0000 mg | ORAL_TABLET | Freq: Four times a day (QID) | ORAL | 0 refills | Status: DC | PRN

## 2018-12-15 MED ORDER — FERROUS SULFATE 325 (65 FE) MG PO TABS: 325.0000 mg | ORAL_TABLET | Freq: Every day | ORAL | 1 refills | Status: DC

## 2018-12-15 NOTE — Discharge Summary (Signed)
OB Discharge Summary     Patient Name: Megan Shannon DOB: [birth year] MRN: [id number]  Date of admission: 12/14/2018 Delivering MD: Thurnell Lose   Date of discharge: 12/15/2018  Admitting diagnosis: pregnancy Intrauterine pregnancy: [redacted]w[redacted]d     Secondary diagnosis:  Active Problems:   Supervision of low-risk pregnancy, third trimester  Additional problems: None     Discharge diagnosis: Term Pregnancy Delivered                                                                                                Post partum procedures:None  Augmentation: AROM, Pitocin and Cytotec  Complications: None  Hospital course:  Induction of Labor With Vaginal Delivery   23 y.o. yo [id number] at [redacted]w[redacted]d was admitted to the hospital 12/14/2018 for induction of labor.  Indication for induction: Favorable cervix at term.  Patient had an uncomplicated labor course as follows: Membrane Rupture Time/Date: 9:30 AM ,12/14/2018   Intrapartum Procedures: Episiotomy: None [1]                                         Lacerations:  Vaginal [6]  Patient had delivery of a Viable infant.  Information for the patient's newborn:  Megan, Shannon [[id number]]  Delivery Method: Vag-Spont(filed from delivery)    12/14/2018  Details of delivery can be found in separate delivery note.  Patient had a routine postpartum course. Patient is discharged home 12/15/18.  Physical exam  Vitals:   12/14/18 1733 12/14/18 2145 12/15/18 0240 12/15/18 0500  BP: (!) 94/56 120/70 111/68 (!) 105/59  Pulse: 82 77 66 72  Resp: 18 16 17 16   Temp: 98.5 F (36.9 C) 98.2 F (36.8 C) 98.9 F (37.2 C) 98.3 F (36.8 C)  TempSrc: Oral Oral Oral Oral  SpO2:      Weight:      Height:       General: alert, cooperative and no distress Lochia: appropriate Uterine Fundus: firm Incision: N/A DVT Evaluation: No evidence of DVT seen on physical exam. Labs: Lab Results  Component Value Date   WBC 9.4 12/15/2018   HGB 8.8  (L) 12/15/2018   HCT 27.8 (L) 12/15/2018   MCV 85.0 12/15/2018   PLT 115 (L) 12/15/2018   CMP Latest Ref Rng & Units 04/25/2015  Glucose 65 - 99 mg/dL 100(H)  BUN 6 - 20 mg/dL <5(L)  Creatinine 0.44 - 1.00 mg/dL 0.56  Sodium 135 - 145 mmol/L 138  Potassium 3.5 - 5.1 mmol/L 4.3  Chloride 101 - 111 mmol/L 103  CO2 22 - 32 mmol/L 23  Calcium 8.9 - 10.3 mg/dL 9.4  Total Protein 6.5 - 8.1 g/dL 6.8  Total Bilirubin 0.3 - 1.2 mg/dL 0.5  Alkaline Phos 38 - 126 U/L 51  AST 15 - 41 U/L 13(L)  ALT 14 - 54 U/L 10(L)    Discharge instruction: per After Visit Summary and "Baby and Me Booklet".  After visit meds:  Allergies as of 12/15/2018   No Known Allergies  Medication List    STOP taking these medications   cyclobenzaprine 5 MG tablet Commonly known as: FLEXERIL   meloxicam 7.5 MG tablet Commonly known as: Mobic   methocarbamol 500 MG tablet Commonly known as: ROBAXIN   tiZANidine 4 MG tablet Commonly known as: Zanaflex     TAKE these medications   ferrous sulfate 325 (65 FE) MG tablet Take 1 tablet (325 mg total) by mouth daily with breakfast. Start taking on: December 16, 2018   ibuprofen 600 MG tablet Commonly known as: ADVIL Take 1 tablet (600 mg total) by mouth every 6 (six) hours as needed.   predniSONE 10 MG (21) Tbpk tablet Commonly known as: STERAPRED UNI-PAK 21 TAB Take as directed       Diet: routine diet  Activity: Advance as tolerated. Pelvic rest for 6 weeks.   Outpatient follow up:6 weeks Follow up Appt:No future appointments. Follow up Visit:No follow-ups on file.  Postpartum contraception: Not Discussed  Newborn Data: Live born female  Birth Weight: 8 lb (3630 g) APGAR: 9, 9  Newborn Delivery   Birth date/time: 12/14/2018 10:30:00 Delivery type: Vaginal, Spontaneous      Baby Feeding: Bottle and Breast Disposition:home with mother   12/15/2018 Megan Leitzara Nataline Basara, MD

## 2019-02-20 ENCOUNTER — Other Ambulatory Visit: Payer: Self-pay

## 2019-02-20 DIAGNOSIS — Z20822 Contact with and (suspected) exposure to covid-19: Secondary | ICD-10-CM

## 2019-02-22 LAB — NOVEL CORONAVIRUS, NAA: SARS-CoV-2, NAA: DETECTED — AB

## 2019-03-19 ENCOUNTER — Ambulatory Visit (INDEPENDENT_AMBULATORY_CARE_PROVIDER_SITE_OTHER): Payer: Medicaid Other

## 2019-03-19 ENCOUNTER — Other Ambulatory Visit: Payer: Self-pay | Admitting: Radiology

## 2019-03-19 DIAGNOSIS — M79674 Pain in right toe(s): Secondary | ICD-10-CM | POA: Diagnosis not present

## 2019-07-24 ENCOUNTER — Other Ambulatory Visit: Payer: Self-pay | Admitting: Orthopaedic Surgery

## 2019-07-24 MED ORDER — PREDNISONE 10 MG (21) PO TBPK: ORAL_TABLET | ORAL | 0 refills | Status: DC

## 2020-01-26 ENCOUNTER — Other Ambulatory Visit: Payer: Self-pay | Admitting: Orthopaedic Surgery

## 2020-01-28 NOTE — Telephone Encounter (Signed)
Yes already sent in.

## 2020-01-28 NOTE — Telephone Encounter (Signed)
I believe you have already done this? If so can you sign off on it?

## 2020-03-17 ENCOUNTER — Other Ambulatory Visit: Payer: Self-pay | Admitting: Family Medicine

## 2020-03-17 MED ORDER — POLYMYXIN B-TRIMETHOPRIM 10000-0.1 UNIT/ML-% OP SOLN: 2.0000 [drp] | Freq: Four times a day (QID) | OPHTHALMIC | 0 refills | Status: DC

## 2020-03-17 NOTE — Progress Notes (Signed)
Pt is having swelling of left upper eyelid consistent with a stye.  Will call in polytrim eye drops and she'll keep using warm compresses.

## 2020-05-08 ENCOUNTER — Encounter (HOSPITAL_BASED_OUTPATIENT_CLINIC_OR_DEPARTMENT_OTHER): Payer: Self-pay | Admitting: Emergency Medicine

## 2020-05-08 ENCOUNTER — Other Ambulatory Visit: Payer: Self-pay

## 2020-05-08 ENCOUNTER — Emergency Department (HOSPITAL_BASED_OUTPATIENT_CLINIC_OR_DEPARTMENT_OTHER)
Admission: EM | Admit: 2020-05-08 | Discharge: 2020-05-08 | Disposition: A | Discharge status: Home / Self Care | Payer: Medicaid Other | Attending: Emergency Medicine | Admitting: Emergency Medicine

## 2020-05-08 DIAGNOSIS — L539 Erythematous condition, unspecified: Secondary | ICD-10-CM | POA: Insufficient documentation

## 2020-05-08 DIAGNOSIS — J069 Acute upper respiratory infection, unspecified: Secondary | ICD-10-CM | POA: Diagnosis not present

## 2020-05-08 DIAGNOSIS — R070 Pain in throat: Secondary | ICD-10-CM | POA: Diagnosis present

## 2020-05-08 DIAGNOSIS — Z20822 Contact with and (suspected) exposure to covid-19: Secondary | ICD-10-CM | POA: Diagnosis not present

## 2020-05-08 LAB — GROUP A STREP BY PCR: Group A Strep by PCR: NOT DETECTED

## 2020-05-08 LAB — RESP PANEL BY RT-PCR (FLU A&B, COVID) ARPGX2
Influenza A by PCR: NEGATIVE
Influenza B by PCR: NEGATIVE
SARS Coronavirus 2 by RT PCR: NEGATIVE

## 2020-05-08 NOTE — ED Provider Notes (Signed)
[facility] EMERGENCY DEPARTMENT Provider Note   CSN: [id number] Arrival date & time: 05/08/20  9509     History Chief Complaint  Patient presents with  . Sore Throat    Megan Shannon is a 24 y.o. female who presents to the ED today with complaint of gradual onset, constant, achy, sore throat x 4 days. Pt reports that in the beginning of the week she was having abdominal pain, nausea, vomiting, and diarrhea after eating [employer] however her family all ate at [facility] without issue. She states that these symptoms resolved 2 days ago however she began having a persistent sore throat, body aches, headache, and subjective fevers. She denies any recent sick contacts. She is fully vaccinated against COVID 19 however is unsure when she received her 2 doses. She has been taking OTC medications with mild relief. Denies chills, cough, SOB, chest pain, urinary sx, neck stiffness, rash, or any other associated symptoms.   The history is provided by the patient and medical records.       Past Medical History:  Diagnosis Date  . Atypical chest pain   . Seasonal allergies     Patient Active Problem List   Diagnosis Date Noted  . Supervision of low-risk pregnancy, third trimester 12/14/2018  . Post term pregnancy over 40 weeks 12/26/2015  . Atypical chest pain 07/18/2015    Past Surgical History:  Procedure Laterality Date  . NONE       OB History    Gravida  2   Para  2   Term  2   Preterm      AB      Living  2     SAB      TAB      Ectopic      Multiple  0   Live Births  2           Family History  Problem Relation Age of Onset  . Thyroid nodules Brother   . Diabetes Maternal Grandmother     Social History   Tobacco Use  . Smoking status: Never Smoker  . Smokeless tobacco: Never Used  Vaping Use  . Vaping Use: Never used  Substance Use Topics  . Alcohol use: Yes  . Drug use: No    Home Medications Prior to Admission medications     Medication Sig Start Date End Date Taking? Authorizing Provider  ferrous sulfate 325 (65 FE) MG tablet Take 1 tablet (325 mg total) by mouth daily with breakfast. 12/16/18  Yes Gerald Leitz, MD  ibuprofen (ADVIL) 600 MG tablet Take 1 tablet (600 mg total) by mouth every 6 (six) hours as needed. 12/15/18  Yes Gerald Leitz, MD  predniSONE (STERAPRED UNI-PAK 21 TAB) 10 MG (21) TBPK tablet Take as directed 04/27/18   Tarry Kos, MD  predniSONE (STERAPRED UNI-PAK 21 TAB) 10 MG (21) TBPK tablet Take as directed 07/24/19   Tarry Kos, MD  trimethoprim-polymyxin b (POLYTRIM) ophthalmic solution Place 2 drops into the left eye every 6 (six) hours. 03/17/20   Hilts, Casimiro Needle, MD    Allergies    Patient has no known allergies.  Review of Systems   Review of Systems  Constitutional: Positive for fever (subjective). Negative for chills.  HENT: Positive for sore throat. Negative for trouble swallowing and voice change.   Respiratory: Negative for cough and shortness of breath.   Musculoskeletal: Positive for myalgias. Negative for neck pain and neck stiffness.  Neurological: Positive for headaches.  Physical Exam Updated Vital Signs BP 138/79 (BP Location: Left Arm)   Pulse (!) 107   Temp 98.1 F (36.7 C) (Oral)   Resp 18   Ht 5\' 5"  (1.651 m)   Wt 86.2 kg   SpO2 99%   BMI 31.62 kg/m   Physical Exam Vitals and nursing note reviewed.  Constitutional:      Appearance: She is not ill-appearing.  HENT:     Head: Normocephalic and atraumatic.     Right Ear: Tympanic membrane normal.     Left Ear: Tympanic membrane normal.     Mouth/Throat:     Mouth: Mucous membranes are moist.     Pharynx: Posterior oropharyngeal erythema present. No oropharyngeal exudate.  Eyes:     Conjunctiva/sclera: Conjunctivae normal.  Neck:     Meningeal: Brudzinski's sign and Kernig's sign absent.  Cardiovascular:     Rate and Rhythm: Normal rate and regular rhythm.  Pulmonary:     Effort: Pulmonary effort is  normal.     Breath sounds: Normal breath sounds. No wheezing, rhonchi or rales.  Abdominal:     Tenderness: There is no abdominal tenderness. There is no guarding or rebound.  Musculoskeletal:     Cervical back: Normal range of motion.  Skin:    General: Skin is warm and dry.     Coloration: Skin is not jaundiced.  Neurological:     General: No focal deficit present.     Mental Status: She is alert and oriented to person, place, and time.     ED Results / Procedures / Treatments   Labs (all labs ordered are listed, but only abnormal results are displayed) Labs Reviewed  GROUP A STREP BY PCR  RESP PANEL BY RT-PCR (FLU A&B, COVID) [id number]    EKG None  Radiology No results found.  Procedures Procedures (including critical care time)  Medications Ordered in ED Medications - No data to display  ED Course  I have reviewed the triage vital signs and the nursing notes.  Pertinent labs & imaging results that were available during my care of the patient were reviewed by me and considered in my medical decision making (see chart for details).    MDM Rules/Calculators/A&P                          24 year old female presenting to the ED today with complaint of sore throat, headache, body aches, subjective fevers x 4 days. Had abdominal pain, nausea, vomiting, diarrhea earlier in the week. Is fully vaccinated against COVID. No recent sick contacts. On arrival to the ED pt is afebrile, mildly tachy in the 110s, and nontachypneic. Appears to be in NAD. On exam TMs are clear without signs of AOM. Her throat is mildly erythematous; no exudate. A strep test was collected prior to being seen - will await results. No signs of deep space infection. Pt is phonating normally; no voice change or drooling. No focal neuro deficits or meningeal signs today. Her abdomen is soft and nontender and pt states the vomiting and diarrhea have resolved. Will add on COVID test at this time. If strep and COVID  negative suspect viral illness. Do not feel pt needs to wait for her COVID results given it will take 2 hours. Will discharge home if strep negative and have pt wait on results. Pt instructed on symptomatic treatment including OTC medications and drinking plenty of fluids to stay hydrated. She is in  agreement with plan. She is instructed to self isolate for 14 days if COVID returns positive.   Strep negative. Will discharge at this time with instructions to await COVID results. Pt is In agreement with plan and stable for discharge.   This note was prepared using Dragon voice recognition software and may include unintentional dictation errors due to the inherent limitations of voice recognition software.  Megan Shannon was evaluated in Emergency Department on 05/08/2020 for the symptoms described in the history of present illness. She was evaluated in the context of the global COVID-19 pandemic, which necessitated consideration that the patient might be at risk for infection with the SARS-CoV-2 virus that causes COVID-19. Institutional protocols and algorithms that pertain to the evaluation of patients at risk for COVID-19 are in a state of rapid change based on information released by regulatory bodies including the CDC and federal and state organizations. These policies and algorithms were followed during the patient's care in the ED.   Final Clinical Impression(s) / ED Diagnoses Final diagnoses:  Upper respiratory tract infection, unspecified type  Person under investigation for COVID-19    Rx / DC Orders ED Discharge Orders    None       Discharge Instructions     Your strep test was negative. We have swabbed you for COVID - please await results. We will call you if you test positive. You can also check your results via mychart. If positive you will need to self isolate for 14 days starting today (cleared: 12/10). If negative then your symptoms are likely related to a viral  illness and should pass in a couple of days.    Drink plenty of fluids to stay hydrated. Take Tylenol as needed for fevers > 100.4, Take Ibuprofen as needed for body aches and headache.   Follow up with your PCP regarding ED visit  Return to the ED for any worsening symptoms       Tanda Rockers, PA-C 05/08/20 1056    Tegeler, Canary Brim, MD 05/08/20 1527

## 2020-05-08 NOTE — ED Triage Notes (Addendum)
Feeling bad since Monday vomited earlier this week now sorethroat and h/a has an ear ache also

## 2020-05-08 NOTE — Discharge Instructions (Signed)
Your strep test was negative. We have swabbed you for COVID - please await results. We will call you if you test positive. You can also check your results via mychart. If positive you will need to self isolate for 14 days starting today (cleared: 12/10). If negative then your symptoms are likely related to a viral illness and should pass in a couple of days.    Drink plenty of fluids to stay hydrated. Take Tylenol as needed for fevers > 100.4, Take Ibuprofen as needed for body aches and headache.   Follow up with your PCP regarding ED visit  Return to the ED for any worsening symptoms

## 2020-09-22 ENCOUNTER — Other Ambulatory Visit: Payer: Self-pay | Admitting: Orthopaedic Surgery

## 2020-09-22 MED ORDER — PREDNISONE 10 MG (21) PO TBPK: ORAL_TABLET | ORAL | 0 refills | Status: DC

## 2020-10-28 ENCOUNTER — Other Ambulatory Visit: Payer: Self-pay | Admitting: Orthopaedic Surgery

## 2020-10-28 MED ORDER — PREDNISONE 10 MG (21) PO TBPK: ORAL_TABLET | ORAL | 0 refills | Status: DC

## 2021-01-06 ENCOUNTER — Other Ambulatory Visit: Payer: Self-pay | Admitting: Family Medicine

## 2021-01-06 MED ORDER — AZITHROMYCIN 250 MG PO TABS: ORAL_TABLET | ORAL | 0 refills | Status: DC

## 2021-03-12 ENCOUNTER — Other Ambulatory Visit: Payer: Self-pay | Admitting: Orthopaedic Surgery

## 2021-03-12 MED ORDER — PREDNISONE 10 MG (21) PO TBPK: ORAL_TABLET | ORAL | 12 refills | Status: DC

## 2021-05-25 ENCOUNTER — Ambulatory Visit (HOSPITAL_COMMUNITY)
Admission: EM | Admit: 2021-05-25 | Discharge: 2021-05-25 | Disposition: A | Discharge status: Home / Self Care | Payer: Medicaid Other | Attending: Family Medicine | Admitting: Family Medicine

## 2021-05-25 ENCOUNTER — Encounter (HOSPITAL_COMMUNITY): Payer: Self-pay | Admitting: Emergency Medicine

## 2021-05-25 ENCOUNTER — Other Ambulatory Visit: Payer: Self-pay

## 2021-05-25 DIAGNOSIS — H9203 Otalgia, bilateral: Secondary | ICD-10-CM

## 2021-05-25 DIAGNOSIS — J020 Streptococcal pharyngitis: Secondary | ICD-10-CM | POA: Diagnosis not present

## 2021-05-25 LAB — POCT RAPID STREP A, ED / UC: Streptococcus, Group A Screen (Direct): POSITIVE — AB

## 2021-05-25 LAB — POC INFLUENZA A AND B ANTIGEN (URGENT CARE ONLY)
INFLUENZA A ANTIGEN, POC: NEGATIVE
INFLUENZA B ANTIGEN, POC: NEGATIVE

## 2021-05-25 MED ORDER — AMOXICILLIN 500 MG PO CAPS: 500.0000 mg | ORAL_CAPSULE | Freq: Three times a day (TID) | ORAL | 0 refills | Status: AC

## 2021-05-25 NOTE — Discharge Instructions (Addendum)
Your strep test has come back positive today.  I have treated you with an antibiotic today.  Please take all of this.  I recommend a new toothbrush in 48 hrs;  Please launder all of your bed sheets/pillow cases as well.  Your flu swab was negative.  I recommend rest, fluids, and tylenol/motrin for sore throat, headache and body aches.  Please follow up with your PCP if not improving.

## 2021-05-25 NOTE — ED Provider Notes (Signed)
[facility]    CSN: [id number] Arrival date & time: 05/25/21  1049      History   Chief Complaint Chief Complaint  Patient presents with   Cough    HPI Megan Shannon is a 25 y.o. female.   Patient is here for sore throat, ear pain right worse and left;  body aches, headaches.  This started yesterday.  She has very little/mild cough;  she feels hot off/on, not sure about fever.   Having chills; she did not sleep last night. She took tylenol/motrin/mucinex yesterday;  Her kids have been sick with a cough only.   Past Medical History:  Diagnosis Date   Atypical chest pain    Seasonal allergies     Patient Active Problem List   Diagnosis Date Noted   Supervision of low-risk pregnancy, third trimester 12/14/2018   Post term pregnancy over 40 weeks 12/26/2015   Atypical chest pain 07/18/2015    Past Surgical History:  Procedure Laterality Date   NONE      OB History     Gravida  2   Para  2   Term  2   Preterm      AB      Living  2      SAB      IAB      Ectopic      Multiple  0   Live Births  2            Home Medications    Prior to Admission medications   Medication Sig Start Date End Date Taking? Authorizing Provider  ferrous sulfate 325 (65 FE) MG tablet Take 1 tablet (325 mg total) by mouth daily with breakfast. 12/16/18   Gerald Leitz, MD  ibuprofen (ADVIL) 600 MG tablet Take 1 tablet (600 mg total) by mouth every 6 (six) hours as needed. 12/15/18   Gerald Leitz, MD  trimethoprim-polymyxin b (POLYTRIM) ophthalmic solution Place 2 drops into the left eye every 6 (six) hours. 03/17/20   Hilts, Casimiro Needle, MD    Family History Family History  Problem Relation Age of Onset   Healthy Mother    Thyroid nodules Brother    Diabetes Maternal Grandmother     Social History Social History   Tobacco Use   Smoking status: Never   Smokeless tobacco: Never  Vaping Use   Vaping Use: Never used  Substance Use Topics    Alcohol use: Yes   Drug use: Yes     Allergies   Patient has no known allergies.   Review of Systems Review of Systems  Constitutional:  Positive for chills and fatigue. Negative for activity change and fever.  HENT:  Positive for congestion, ear pain, sore throat and trouble swallowing.   Respiratory:  Positive for cough. Negative for shortness of breath.   Cardiovascular: Negative.     Physical Exam Triage Vital Signs ED Triage Vitals  Enc Vitals Group     BP 05/25/21 1253 130/83     Pulse Rate 05/25/21 1253 (!) 106     Resp 05/25/21 1253 18     Temp 05/25/21 1253 99.8 F (37.7 C)     Temp Source 05/25/21 1253 Oral     SpO2 05/25/21 1253 99 %     Weight --      Height --      Head Circumference --      Peak Flow --      Pain Score 05/25/21 1250 4  Pain Loc --      Pain Edu? --      Excl. in GC? --    No data found.  Updated Vital Signs BP 130/83 (BP Location: Right Arm)   Pulse (!) 106   Temp 99.8 F (37.7 C) (Oral)   Resp 18   LMP 04/25/2021   SpO2 99%  r:     Physical Exam Constitutional:      Appearance: Normal appearance.  HENT:     Head: Normocephalic and atraumatic.     Right Ear: Tympanic membrane and ear canal normal.     Left Ear: Tympanic membrane and ear canal normal.     Nose: Nose normal.     Mouth/Throat:     Mouth: Mucous membranes are moist.     Pharynx: Oropharyngeal exudate and posterior oropharyngeal erythema present.     Tonsils: Tonsillar exudate present. 2+ on the right. 2+ on the left.  Cardiovascular:     Rate and Rhythm: Normal rate and regular rhythm.  Pulmonary:     Effort: Pulmonary effort is normal.     Breath sounds: Normal breath sounds.  Abdominal:     General: Abdomen is flat.  Musculoskeletal:     Cervical back: Normal range of motion and neck supple.  Neurological:     Mental Status: She is alert.     UC Treatments / Results  Labs (all labs ordered are listed, but only abnormal results are  displayed) Labs Reviewed  POCT RAPID STREP A, ED / UC - Abnormal; Notable for the following components:      Result Value   Streptococcus, Group A Screen (Direct) POSITIVE (*)    All other components within normal limits  POC INFLUENZA A AND B ANTIGEN (URGENT CARE ONLY)    EKG   Radiology No results found.  Procedures Procedures (including critical care time)  Medications Ordered in UC Medications - No data to display  Initial Impression / Assessment and Plan / UC Course  I have reviewed the triage vital signs and the nursing notes.  Pertinent labs & imaging results that were available during my care of the patient were reviewed by me and considered in my medical decision making (see chart for details).   Strep test is positive today;  Treated with amoxil x 10 days;  Flu is negative.  Will use motrin/tylenol/salt water gargles for pain/fever.    Final Clinical Impressions(s) / UC Diagnoses   Final diagnoses:  Streptococcal sore throat  Ear pain, bilateral     Discharge Instructions      Your strep test has come back positive today.  I have treated you with an antibiotic today.  Please take all of this.  I recommend a new toothbrush in 48 hrs;  Please launder all of your bed sheets/pillow cases as well.  Your flu swab was negative.  I recommend rest, fluids, and tylenol/motrin for sore throat, headache and body aches.  Please follow up with your PCP if not improving.      ED Prescriptions     Medication Sig Dispense Auth. Provider   amoxicillin (AMOXIL) 500 MG capsule Take 1 capsule (500 mg total) by mouth 3 (three) times daily for 10 days. 30 capsule Jannifer Franklin, MD      PDMP not reviewed this encounter.   Jannifer Franklin, MD 05/25/21 [phone]

## 2021-05-25 NOTE — ED Notes (Signed)
Strep swab and flu in lab

## 2021-05-25 NOTE — ED Triage Notes (Signed)
Patient started feeling bad yesterday.  Symptoms have worsened since onset.  Complains of a cough, sore throat, bilateral ear pain, right ear worse than left, general aches .  Patient reports she has 2 children with coughs at home

## 2021-06-09 ENCOUNTER — Other Ambulatory Visit: Payer: Self-pay

## 2021-06-09 ENCOUNTER — Ambulatory Visit (INDEPENDENT_AMBULATORY_CARE_PROVIDER_SITE_OTHER): Payer: Medicaid Other | Admitting: Family Medicine

## 2021-06-09 VITALS — BP 117/68 | HR 72 | Wt 200.5 lb

## 2021-06-09 DIAGNOSIS — O219 Vomiting of pregnancy, unspecified: Secondary | ICD-10-CM

## 2021-06-09 DIAGNOSIS — Z3200 Encounter for pregnancy test, result unknown: Secondary | ICD-10-CM | POA: Diagnosis not present

## 2021-06-09 DIAGNOSIS — Z3490 Encounter for supervision of normal pregnancy, unspecified, unspecified trimester: Secondary | ICD-10-CM | POA: Diagnosis not present

## 2021-06-09 LAB — POCT PREGNANCY, URINE: Preg Test, Ur: POSITIVE — AB

## 2021-06-09 MED ORDER — PROMETHAZINE HCL 25 MG PO TABS
25.0000 mg | ORAL_TABLET | Freq: Four times a day (QID) | ORAL | 0 refills | Status: DC | PRN
Start: 2021-06-09 — End: 2021-09-30

## 2021-06-09 MED ORDER — PRENATAL PLUS 27-1 MG PO TABS: 1.0000 | ORAL_TABLET | Freq: Every day | ORAL | 11 refills | Status: DC

## 2021-06-09 NOTE — Progress Notes (Signed)
Possible Pregnancy  Here today for pregnancy confirmation. UPT in office today is positive. Pt reports first positive home UPT on 05/31/21. Reviewed dating with patient:   LMP: 04/20/21 EDD: 01/25/22 7w 1d today  OB history reviewed; 2 prior full term vaginal deliveries. Reviewed medications and allergies with patient; list of medications safe to take during pregnancy given.  Recommended pt begin prenatal vitamin and schedule prenatal care. Pt would like to receive care in our office, front office to schedule during check out. Patient reports nausea and vomiting, worse in the morning. Offered Phenergan prescription or Unisom otc. Pt desires Phenergan. Crissie Reese, MD to bedside for brief visit.  Marjo Bicker, RN 06/09/2021  4:12 PM

## 2021-06-09 NOTE — Progress Notes (Signed)
°  History:  [name] is a 25 y.o. G3P2002 who presents to clinic today with complaint of possible pregnancy.   +home UPT Has been having lots of nausea   Past Medical History:  Diagnosis Date   Atypical chest pain    Seasonal allergies     Past Surgical History:  Procedure Laterality Date   NONE      The following portions of the patient's history were reviewed and updated as appropriate: allergies, current medications, past family history, past medical history, past social history, past surgical history and problem list.   Review of Systems:  Pertinent items noted in HPI and remainder of comprehensive ROS otherwise negative.  Objective:  Physical Exam BP 117/68    Pulse 72    Wt 200 lb 8 oz (90.9 kg)    LMP 04/20/2021 (Exact Date)    Breastfeeding No    BMI 33.36 kg/m  Physical Exam Vitals reviewed.  Constitutional:      General: She is not in acute distress.    Appearance: She is well-developed. She is not diaphoretic.  Eyes:     General: No scleral icterus. Pulmonary:     Effort: Pulmonary effort is normal. No respiratory distress.  Skin:    General: Skin is warm and dry.  Neurological:     Mental Status: She is alert.     Coordination: Coordination normal.     Labs and Imaging Results for orders placed or performed in visit on 06/09/21 (from the past 24 hour(s))  Pregnancy, urine POC     Status: Abnormal   Collection Time: 06/09/21  4:09 PM  Result Value Ref Range   Preg Test, Ur POSITIVE (A) NEGATIVE    No results found.   Assessment & Plan:  1. Possible pregnancy UPT positive Some nausea Rx for prenatals and anti-emetics Plans to establish prenatal care at [facility] - Pregnancy, urine POC  2. Early stage of pregnancy  - prenatal vitamin w/FE, FA (PRENATAL 1 + 1) 27-1 MG TABS tablet; Take 1 tablet by mouth daily at 12 noon.  Dispense: 30 tablet; Refill: 11  3. Nausea and vomiting during pregnancy  - promethazine (PHENERGAN) 25 MG  tablet; Take 1 tablet (25 mg total) by mouth every 6 (six) hours as needed for nausea or vomiting.  Dispense: 30 tablet; Refill: 0    Approximately 15 minutes of total time was spent with this patient on history taking, coordination of care, education and documentation.   Venora Maples, MD 06/09/2021 4:34 PM

## 2021-06-09 NOTE — Patient Instructions (Signed)

## 2021-06-14 NOTE — L&D Delivery Note (Signed)
Delivery Note   Megan Shannon, Megan Shannon [[id number]]  Pt was turned to her side and suddenly felt pressure.  Cx noted to be 10cms w/vtx at +2 station.  Pt pushed w/1 ctx, and at 6:48 PM a viable female was delivered via Vaginal, Spontaneous (Presentation: LOA ).  APGAR: 9, 9; weight 6 lb 2.8 oz (2800 g).   After one minute, the cord was doubly clamped and cut by FOB. Ctx were now about 5 minutes apart.  SROM around 1850 w/clear fluid.  Pt pushed through 3 contractions, and:    Megan Shannon, Megan Shannon [[id number]]  At 6:59 PM a viable female was delivered via Vaginal, Breech (Presentation: frank breech).  The hips were grasped and baby rotated slightly to a back up position; legs delivered and the rest of the baby delivered quite easily and smoothly with the same push.APGAR: 8, 9; weight pending. After 1 minute, the cord was doubly clamped and cut.    Pitocin bolus started and the placentas separated spontaneously, delivering via CCT. They were inspected and appear to be intact, fused together, w/3VC X2. Marland Kitchen  Anesthesia:  epidural Episiotomy: None Lacerations: None Suture Repair:  Est. Blood Loss (mL): 285   Mom to postpartum.   Baby A to Couplet care / Skin to Skin.   Baby B to Couplet care / Skin to Skin   Post-Placental IUD Insertion Procedure Note  Patient identified, informed consent signed prior to delivery, signed copy in chart, time out was performed.    Vaginal, labial and perineal areas thoroughly inspected for lacerations. None were found.  A mirena IUD inserted with inserter per manufacturer's instructions.    Strings trimmed to the level of the introitus. Patient tolerated procedure well.  Patient given post procedure instructions and IUD care card with expiration date.  Patient is asked to keep IUD strings tucked in her vagina until her postpartum follow up visit in 4-6 weeks. Patient advised to abstain from sexual intercourse and pulling on strings before her  follow-up visit. Patient verbalized an understanding of the plan of care and agrees.  Scarlette Calico Cresenzo-Dishmon 01/04/2022, 7:29 PM

## 2021-06-23 ENCOUNTER — Telehealth: Payer: Medicaid Other

## 2021-06-23 DIAGNOSIS — Z3480 Encounter for supervision of other normal pregnancy, unspecified trimester: Secondary | ICD-10-CM

## 2021-06-23 DIAGNOSIS — Z3A00 Weeks of gestation of pregnancy not specified: Secondary | ICD-10-CM

## 2021-06-23 MED ORDER — BLOOD PRESSURE MONITORING DEVI: 1.0000 | 0 refills | Status: DC

## 2021-06-23 NOTE — Progress Notes (Signed)
New OB Intake  I connected with  Megan Shannon on 06/23/21 at 11:15 AM EST by MyChart Video Visit and verified that I am speaking with the correct person using two identifiers. Nurse is located at [facility] and pt is located at home.  I discussed the limitations, risks, security and privacy concerns of performing an evaluation and management service by telephone and the availability of in person appointments. I also discussed with the patient that there may be a patient responsible charge related to this service. The patient expressed understanding and agreed to proceed.  I explained I am completing New OB Intake today. We discussed her EDD of 01/30/22 that is based on LMP of 04/20/21. Pt is G3/P2. I reviewed her allergies, medications, Medical/Surgical/OB history, and appropriate screenings. I informed her of [facility] services. Based on history, this is a/an  pregnancy uncomplicated .   Patient Active Problem List   Diagnosis Date Noted   Supervision of low-risk pregnancy, third trimester 12/14/2018   Post term pregnancy over 40 weeks 12/26/2015   Atypical chest pain 07/18/2015    Concerns addressed today  Delivery Plans:  Plans to deliver at [facility].   MyChart/Babyscripts MyChart access verified. I explained pt will have some visits in office and some virtually. Babyscripts instructions given and order placed. Patient verifies receipt of registration text/e-mail. Account successfully created and app downloaded.  Blood Pressure Cuff  Blood pressure cuff ordered for patient to pick-up from [employer]. Explained after first prenatal appt pt will check weekly and document in 75.  Weight scale: Patient does / does not  have weight scale. Weight scale ordered for patient to pick up from [employer].   Anatomy US Explained first scheduled [place] will be around 19 weeks. Anatomy US scheduled for 08/31/21 at 2:15 a. Pt notified to arrive at 02:00 a.  Labs Discussed Johnsie Cancel  genetic screening with patient. Would like both Panorama and Horizon drawn at new OB visit. Routine prenatal labs needed.  Covid Vaccine Patient has covid vaccine.   CenteringPregnancy Candidate?  If yes, offer as possibility  Mother/ Baby Dyad Candidate?    If yes, offer as possibility  Informed patient of [facility] website  and placed link in her AVS.   Social Determinants of Health Food Insecurity: Patient denies food insecurity. WIC Referral: Patient is interested in referral to [facility].  Transportation: Patient denies transportation needs. Childcare: Discussed no children allowed at ultrasound appointments. Offered childcare services; patient declines childcare services at this time.  Send link to Pregnancy Navigators   Placed OB Box on problem list and updated  First visit review I reviewed new OB appt with pt. I explained she will have a pelvic exam, ob bloodwork with genetic screening, and PAP smear. Explained pt will be seen by Dr. Higinio Plan at first visit; encounter routed to appropriate provider. Explained that patient will be seen by pregnancy navigator following visit with provider. [facility] information placed in AVS.   Bethanne Ginger, [place] 06/23/2021  11:32 AM

## 2021-07-06 ENCOUNTER — Other Ambulatory Visit: Payer: Self-pay

## 2021-07-06 ENCOUNTER — Other Ambulatory Visit: Payer: Self-pay | Admitting: Obstetrics & Gynecology

## 2021-07-06 ENCOUNTER — Other Ambulatory Visit (HOSPITAL_COMMUNITY)
Admission: RE | Admit: 2021-07-06 | Discharge: 2021-07-06 | Disposition: A | Discharge status: Home / Self Care | Payer: Medicaid Other | Source: Ambulatory Visit | Attending: Family Medicine | Admitting: Family Medicine

## 2021-07-06 ENCOUNTER — Ambulatory Visit (INDEPENDENT_AMBULATORY_CARE_PROVIDER_SITE_OTHER): Payer: Medicaid Other | Admitting: Family Medicine

## 2021-07-06 ENCOUNTER — Encounter: Payer: Self-pay | Admitting: Family Medicine

## 2021-07-06 VITALS — BP 123/75 | HR 86 | Wt 198.0 lb

## 2021-07-06 DIAGNOSIS — Z3A11 11 weeks gestation of pregnancy: Secondary | ICD-10-CM | POA: Diagnosis not present

## 2021-07-06 DIAGNOSIS — O219 Vomiting of pregnancy, unspecified: Secondary | ICD-10-CM | POA: Diagnosis not present

## 2021-07-06 DIAGNOSIS — Z3480 Encounter for supervision of other normal pregnancy, unspecified trimester: Secondary | ICD-10-CM | POA: Diagnosis present

## 2021-07-06 DIAGNOSIS — O30001 Twin pregnancy, unspecified number of placenta and unspecified number of amniotic sacs, first trimester: Secondary | ICD-10-CM | POA: Diagnosis not present

## 2021-07-06 DIAGNOSIS — O30049 Twin pregnancy, dichorionic/diamniotic, unspecified trimester: Secondary | ICD-10-CM | POA: Insufficient documentation

## 2021-07-06 DIAGNOSIS — Z01419 Encounter for gynecological examination (general) (routine) without abnormal findings: Secondary | ICD-10-CM

## 2021-07-06 MED ORDER — ASPIRIN EC 81 MG PO TBEC: 81.0000 mg | DELAYED_RELEASE_TABLET | Freq: Every day | ORAL | 1 refills | Status: DC

## 2021-07-06 MED ORDER — DOXYLAMINE-PYRIDOXINE 10-10 MG PO TBEC: DELAYED_RELEASE_TABLET | ORAL | 1 refills | Status: DC

## 2021-07-06 NOTE — Progress Notes (Signed)
Patient wants to know if she could be prescribed something different for nausea due to the promethazine making her "sleepy". Ms. Sharyn Lull also complains of "menstrual like" pelvic cramps that happens "on and off"

## 2021-07-06 NOTE — Progress Notes (Signed)
History:   Megan Shannon is a 26 y.o. G3P2002 at [redacted]w[redacted]d by LMP being seen today for her first obstetrical visit.  Her obstetrical history is significant for two vaginal deliveries without complication. Two boys at home. Patient does intend to breast feed. Pregnancy history fully reviewed.  Patient reports  nausea and occasional lower abdominal cramping since finding out she was pregnant . Not eating/drinking as much as she should due to nausea. This was a planned pregnancy, sure of LMP. Needs pap smear today. She is a CMA at [employer].      HISTORY: OB History  Gravida Para Term Preterm AB Living  3 2 2  0 0 2  SAB IAB Ectopic Multiple Live Births  0 0 0 0 2    # Outcome Date GA Lbr Len/2nd Weight Sex Delivery Anes PTL Lv  3 Current           2 Term 12/14/18 [redacted]w[redacted]d 00:47 / 00:13 8 lb (3.63 kg) M Vag-Spont EPI  LIV     Birth Comments: facial bruising     Name: GARCIA-MOSQUEDA,BOY Lavender     Apgar1: 9  Apgar5: 9  1 Term 12/26/15 [redacted]w[redacted]d 07:46 / 02:26 10 lb 3.3 oz (4.63 kg) M Vag-Spont EPI  LIV     Name: GARCIA-MOSQUEDA,BOY Blessing     Apgar1: 6  Apgar5: 9     Past Medical History:  Diagnosis Date   Atypical chest pain    Seasonal allergies    Past Surgical History:  Procedure Laterality Date   NONE     Family History  Problem Relation Age of Onset   Healthy Mother    Thyroid nodules Brother    Diabetes Maternal Grandmother    Social History   Tobacco Use   Smoking status: Never   Smokeless tobacco: Never  Vaping Use   Vaping Use: Never used  Substance Use Topics   Alcohol use: Yes   Drug use: Yes   No Known Allergies Current Outpatient Medications on File Prior to Visit  Medication Sig Dispense Refill   Blood Pressure Monitoring DEVI 1 each by Does not apply route once a week. 1 each 0   prenatal vitamin w/FE, FA (PRENATAL 1 + 1) 27-1 MG TABS tablet Take 1 tablet by mouth daily at 12 noon. 30 tablet 11   promethazine (PHENERGAN) 25 MG  tablet Take 1 tablet (25 mg total) by mouth every 6 (six) hours as needed for nausea or vomiting. 30 tablet 0   No current facility-administered medications on file prior to visit.   Indications for ASA therapy (per uptodate) One of the following: Previous pregnancy with preeclampsia, especially early onset and with an adverse outcome No Multifetal gestation Yes Chronic hypertension No Type 1 or 2 diabetes mellitus No Chronic kidney disease No Autoimmune disease (antiphospholipid syndrome, systemic lupus erythematosus) No  Two or more of the following: Nulliparity No Obesity (body mass index >30 kg/m2) No Family history of preeclampsia in mother or sister No Age ?35 years No Sociodemographic characteristics (African American race, low socioeconomic level) No Personal risk factors (eg, previous pregnancy with low birth weight or small for gestational age infant, previous adverse pregnancy outcome [eg, stillbirth], interval >10 years between pregnancies) No  Review of Systems Pertinent items noted in HPI and remainder of comprehensive ROS otherwise negative. Physical Exam:   Vitals:   07/06/21 1517  BP: 123/75  Pulse: 86  Weight: 198 lb (89.8 kg)     Bedside Ultrasound for  FHR check: Viable intrauterine twin pregnancy with positive cardiac activity note, fetal heart rate 150 and 155bpm Patient informed that the ultrasound is considered a limited obstetric ultrasound and is not intended to be a complete ultrasound exam.  Patient also informed that the ultrasound is not being completed with the intent of assessing for fetal or placental anomalies or any pelvic abnormalities.  Explained that the purpose of todays ultrasound is to assess for fetal heart rate.  Patient acknowledges the purpose of the exam and the limitations of the study.  Constitutional: Well-developed, well-nourished pregnant female in no acute distress.  HEENT: PERRLA Skin: normal color and turgor, no  rash Cardiovascular: normal rate  Respiratory: normal effort GI: Abd soft, non-tender MS: Extremities nontender, no edema, normal ROM Neurologic: Alert and oriented x 4.  GU: no CVA tenderness Pelvic: NEFG, physiologic discharge, no blood, cervix clean and friable with swabs. Pap/swabs collected  Assessment:    Pregnancy: [id number] Patient Active Problem List   Diagnosis Date Noted   Supervision of other normal pregnancy, antepartum 06/23/2021     Plan:    1. Supervision of other normal pregnancy, antepartum Doing well. Start ASA at 12 weeks.   2. [redacted] weeks gestation of pregnancy  3. Twin gestation in first trimester, unspecified multiple gestation type CMA with difficulty finding FHR, thus used the bedside US and found viable twin pregnancy. Reports family history of twins, no personal previous twin gestation.   4. Nausea/vomiting in pregnancy Start with diclegis. Phenergan makes her too tired. Encouraged sea bands, ginger, small frequent meals, and increasing hydration. F/u if unable to get diclegis or needs different agent.   5. Encounter for pap smear  Collected. Cervix friable, anticipate some spotting in the next day or so.   Initial labs drawn. Continue prenatal vitamins. Problem list reviewed and updated. Genetic Screening discussed, ordered. Ultrasound discussed; fetal anatomic survey: ordered. Anticipatory guidance about prenatal visits given including labs, ultrasounds, and testing. Discussed usage of Babyscripts and virtual visits as additional source of managing and completing prenatal visits in midst of coronavirus and pandemic.   Encouraged to complete MyChart Registration for her ability to review results, send requests, and have questions addressed.  The nature of [facility] with multiple MDs and Advanced Practice Providers was explained to patient; also emphasized that residents, students are part of our  team. Routine obstetric precautions reviewed. Encouraged to seek out care at office or emergency room [facility] preferred) for urgent and/or emergent concerns.  Return in about 4 weeks (around 08/03/2021) for HROB.     Leticia Penna, DO Ob Fellow   07/06/2021  5:05 PM

## 2021-07-07 LAB — CBC/D/PLT+RPR+RH+ABO+RUBIGG...
Antibody Screen: NEGATIVE
Basophils Absolute: 0 10*3/uL (ref 0.0–0.2)
Basos: 0 %
EOS (ABSOLUTE): 0.1 10*3/uL (ref 0.0–0.4)
Eos: 1 %
HCV Ab: <0.1 s/co ratio (ref 0.0–0.9)
HIV Screen 4th Generation wRfx: NONREACTIVE
Hematocrit: 38.7 % (ref 34.0–46.6)
Hemoglobin: 13.5 g/dL (ref 11.1–15.9)
Hepatitis B Surface Ag: NEGATIVE
Immature Grans (Abs): 0 10*3/uL (ref 0.0–0.1)
Immature Granulocytes: 0 %
Lymphocytes Absolute: 1.7 10*3/uL (ref 0.7–3.1)
Lymphs: 20 %
MCH: 30.5 pg (ref 26.6–33.0)
MCHC: 34.9 g/dL (ref 31.5–35.7)
MCV: 88 fL (ref 79–97)
Monocytes Absolute: 0.5 10*3/uL (ref 0.1–0.9)
Monocytes: 6 %
Neutrophils Absolute: 6.2 10*3/uL (ref 1.4–7.0)
Neutrophils: 73 %
Platelets: 156 10*3/uL (ref 150–450)
RBC: 4.42 x10E6/uL (ref 3.77–5.28)
RDW: 12.7 % (ref 11.7–15.4)
RPR Ser Ql: NONREACTIVE
Rh Factor: POSITIVE
Rubella Antibodies, IGG: 1.3 index (ref >0.99)
WBC: 8.5 10*3/uL (ref 3.4–10.8)

## 2021-07-07 LAB — CYTOLOGY - PAP
Chlamydia: NEGATIVE
Comment: NEGATIVE
Comment: NORMAL
Diagnosis: NEGATIVE
Neisseria Gonorrhea: NEGATIVE

## 2021-07-07 LAB — HEMOGLOBIN A1C
Est. average glucose Bld gHb Est-mCnc: 103 mg/dL
Hgb A1c MFr Bld: 5.2 % (ref 4.8–5.6)

## 2021-07-07 LAB — HCV INTERPRETATION

## 2021-07-08 LAB — URINE CULTURE, OB REFLEX

## 2021-07-08 LAB — CULTURE, OB URINE

## 2021-08-03 ENCOUNTER — Ambulatory Visit (INDEPENDENT_AMBULATORY_CARE_PROVIDER_SITE_OTHER): Payer: Medicaid Other | Admitting: Nurse Practitioner

## 2021-08-03 ENCOUNTER — Other Ambulatory Visit: Payer: Self-pay

## 2021-08-03 VITALS — BP 115/54 | HR 88 | Wt 199.0 lb

## 2021-08-03 DIAGNOSIS — R42 Dizziness and giddiness: Secondary | ICD-10-CM

## 2021-08-03 DIAGNOSIS — R002 Palpitations: Secondary | ICD-10-CM

## 2021-08-03 DIAGNOSIS — Z3A15 15 weeks gestation of pregnancy: Secondary | ICD-10-CM

## 2021-08-03 DIAGNOSIS — R7303 Prediabetes: Secondary | ICD-10-CM | POA: Insufficient documentation

## 2021-08-03 DIAGNOSIS — O30001 Twin pregnancy, unspecified number of placenta and unspecified number of amniotic sacs, first trimester: Secondary | ICD-10-CM

## 2021-08-03 DIAGNOSIS — R519 Headache, unspecified: Secondary | ICD-10-CM

## 2021-08-03 DIAGNOSIS — O0990 Supervision of high risk pregnancy, unspecified, unspecified trimester: Secondary | ICD-10-CM

## 2021-08-03 DIAGNOSIS — O219 Vomiting of pregnancy, unspecified: Secondary | ICD-10-CM

## 2021-08-03 NOTE — Progress Notes (Signed)
° ° °  Subjective:  Megan Shannon is a 26 y.o. G3P2002 at [redacted]w[redacted]d being seen today for ongoing prenatal care.  She is currently monitored for the following issues for this high-risk pregnancy and has Supervision of other normal pregnancy, antepartum; Twin gestation in first trimester; and Prediabetes on their problem list.  Patient reports fatigue.  Contractions: Not present. Vag. Bleeding: None.  Movement: Present. Denies leaking of fluid.   The following portions of the patient's history were reviewed and updated as appropriate: allergies, current medications, past family history, past medical history, past social history, past surgical history and problem list. Problem list updated.  Objective:   Vitals:   08/03/21 1403  BP: (!) 115/54  Pulse: 88  Weight: 199 lb (90.3 kg)    Fetal Status: Fetal Heart Rate (bpm): 157/154 Fundal Height: 23 cm Movement: Present     General:  Alert, oriented and cooperative. Patient is in no acute distress.  Skin: Skin is warm and dry. No rash noted.   Cardiovascular: Normal heart rate noted  Respiratory: Normal respiratory effort, no problems with respiration noted  Abdomen: Soft, gravid, appropriate for gestational age. Pain/Pressure: Present     Pelvic:  Cervical exam deferred        Extremities: Normal range of motion.  Edema: None  Mental Status: Normal mood and affect. Normal behavior. Normal judgment and thought content.   Urinalysis:      Assessment and Plan:  Pregnancy: G3P2002 at [redacted]w[redacted]d  1. Supervision of high risk pregnancy, antepartum Describes having dizziness, headaches and periods of time where she can feel her heart racing.  Has a family history of early cardiac death - her father died of cardiac enlargement in his 45's. Reviewed eating small meals with protein every time you eat. Reviewed 64 ounces of water every day. Normal B Reviewed orthostatic changes in pregnancy. 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  2. Twin gestation in first trimester, unspecified multiple gestation type Has anatomy US scheduled in 4 weeks.  - AMB Referral to [facility]  3. Nausea/vomiting in pregnancy Taking Diclegis and is doing better.  4. Palpitations Will have cardiology evaluate.  - AMB Referral to [facility]  5. Dizziness   6. Frequent headaches   7. [redacted] weeks gestation of pregnancy   Preterm labor symptoms and general obstetric precautions including but not limited to vaginal bleeding, contractions, leaking of fluid and fetal movement were reviewed in detail with the patient. Please refer to After Visit Summary for other counseling recommendations.  Return in about 4 weeks (around 08/31/2021).  Earlie Server, RN, MSN, NP-BC Nurse Practitioner, [facility] for [employer], [facility] 08/03/2021 9:35 PM

## 2021-08-13 NOTE — Progress Notes (Signed)
Cardio-Obstetrics Clinic  New Evaluation  Date:  08/14/2021   ID:  Megan Shannon, DOB [birth year], MRN [id number]  PCP:  Maurice Small, MD   [facility] Providers Cardiologist:  None  Electrophysiologist:  None       Referring MD: Currie Paris, NP   Chief Complaint: Palpittions  History of Present Illness:    Megan Shannon is a 26 y.o. female [G3P2002] who is being seen today for the evaluation of palpitations at the request of Burleson, Brand Males, NP.   Today, the patient states that she has a history of palpitations in the past but they have acutely worsened during pregnancy. Has some associated shortness of breath and mild lightheadedness. Each episode lasts a couple of seconds to a couple of minutes before resolving. Occurs 1-2x/week. No known triggers. No syncope. No current nausea or vomiting. No LE edema. No nocturnal palpitations. Saw Dr. Allyson Sabal in 2017 with atypical chest pain which was managed conservatively. Continues to have intermittent chest pain that resolves with drinking water. No exertional symptoms.    Prior CV Studies Reviewed: The following studies were reviewed today: No prior studies  Past Medical History:  Diagnosis Date   Atypical chest pain    Seasonal allergies     Past Surgical History:  Procedure Laterality Date   NONE        OB History     Gravida  3   Para  2   Term  2   Preterm      AB      Living  2      SAB      IAB      Ectopic      Multiple  0   Live Births  2               Current Medications: Current Meds  Medication Sig   aspirin EC 81 MG tablet Take 1 tablet (81 mg total) by mouth daily. Swallow whole.   Blood Pressure Monitoring DEVI 1 each by Does not apply route once a week.   Doxylamine-Pyridoxine (DICLEGIS) 10-10 MG TBEC Take 2 tabs at bedtime. If needed, add another tab in the morning. If needed, add another tab in the afternoon, up to 4 tabs/day. (Patient taking  differently: Take 1 tabs at bedtime. If needed, add another tab in the morning. If needed, add another tab in the afternoon, up to 4 tabs/day.)   prenatal vitamin w/FE, FA (PRENATAL 1 + 1) 27-1 MG TABS tablet Take 1 tablet by mouth daily at 12 noon.     Allergies:   Patient has no known allergies.   Social History   Socioeconomic History   Marital status: Single    Spouse name: Not on file   Number of children: Not on file   Years of education: Not on file   Highest education level: Not on file  Occupational History   Not on file  Tobacco Use   Smoking status: Never   Smokeless tobacco: Never  Vaping Use   Vaping Use: Never used  Substance and Sexual Activity   Alcohol use: Yes   Drug use: Yes   Sexual activity: Yes    Birth control/protection: None  Other Topics Concern   Not on file  Social History Narrative   Not on file   Social Determinants of Health   Financial Resource Strain: Not on file  Food Insecurity: No Food Insecurity   Worried About Programme researcher, broadcasting/film/video in  the Last Year: Never true   Ran Out of Food in the Last Year: Never true  Transportation Needs: No Transportation Needs   Lack of Transportation (Medical): No   Lack of Transportation (Non-Medical): No  Physical Activity: Not on file  Stress: Not on file  Social Connections: Not on file      Family History  Problem Relation Age of Onset   Healthy Mother    Thyroid nodules Brother    Diabetes Maternal Grandmother       ROS:   Please see the history of present illness.    Review of Systems  Constitutional:  Negative for malaise/fatigue.  Respiratory:  Positive for shortness of breath.   Cardiovascular:  Positive for chest pain and palpitations. Negative for orthopnea, claudication and leg swelling.  Gastrointestinal:  Negative for blood in stool.  Genitourinary:  Negative for hematuria.  Musculoskeletal:  Negative for falls.  Neurological:  Positive for dizziness. Negative for loss of  consciousness.     Labs/EKG Reviewed:    EKG:   EKG is  ordered today.  The ekg ordered today demonstrates NSR with HR 100  Recent Labs: 07/06/2021: Hemoglobin 13.5; Platelets 156   Recent Lipid Panel No results found for: CHOL, TRIG, HDL, CHOLHDL, LDLCALC, LDLDIRECT  Physical Exam:    VS:  BP 116/60    Pulse 100    Ht 5\' 5"  (1.651 m)    Wt 200 lb 9.6 oz (91 kg)    LMP 04/20/2021    SpO2 98%    BMI 33.38 kg/m     Wt Readings from Last 3 Encounters:  08/14/21 200 lb 9.6 oz (91 kg)  08/03/21 199 lb (90.3 kg)  07/06/21 198 lb (89.8 kg)     GEN:  Well nourished, well developed in no acute distress HEENT: Normal NECK: No JVD; No carotid bruits CARDIAC: RRR, no murmurs, rubs, gallops RESPIRATORY:  Clear to auscultation without rales, wheezing or rhonchi  ABDOMEN: Gravid, nontender MUSCULOSKELETAL:  No edema; No deformity  SKIN: Warm and dry NEUROLOGIC:  Alert and oriented x 3 PSYCHIATRIC:  Normal affect      ASSESSMENT & PLAN:    #Palpitations: Patient with frequent palpations complicating second trimester of pregnancy. Likely related to significant hormone and HD shifts especially in the setting of twin gestation. Will check cardiac monitor. -Check zio -Increase hydration -Small, frequent meals -Can consider Mg supplementation at night  Patient Instructions  Medication Instructions:   Your physician recommends that you continue on your current medications as directed. Please refer to the Current Medication list given to you today.  *If you need a refill on your cardiac medications before your next appointment, please call your pharmacy*   Testing/Procedures:  ZIO XT- Long Term Monitor Instructions  Your physician has requested you wear a ZIO patch monitor for 7 days.  This is a single patch monitor. [employer] supplies one patch monitor per enrollment. Additional stickers are not available. Please do not apply patch if you will be having a Nuclear Stress Test,   Echocardiogram, Cardiac CT, MRI, or Chest Xray during the period you would be wearing the  monitor. The patch cannot be worn during these tests. You cannot remove and re-apply the  ZIO XT patch monitor.  Your ZIO patch monitor will be mailed 3 day USPS to your address on file. It may take 3-5 days  to receive your monitor after you have been enrolled.  Once you have received your monitor, please review the enclosed  instructions. Your monitor  has already been registered assigning a specific monitor serial # to you.  Billing and Patient Assistance Program Information  We have supplied [employer] with any of your insurance information on file for billing purposes. [employer] offers a sliding scale Patient Assistance Program for patients that do not have  insurance, or whose insurance does not completely cover the cost of the ZIO monitor.  You must apply for the Patient Assistance Program to qualify for this discounted rate.  To apply, please call [employer] at [phone], select option 4, select option 2, ask to apply for  Patient Assistance Program. Meredeth Ide will ask your household income, and how many people  are in your household. They will quote your out-of-pocket cost based on that information.  [employer] will also be able to set up a 63-month, interest-free payment plan if needed.  Applying the monitor   Shave hair from upper left chest.  Hold abrader disc by orange tab. Rub abrader in 40 strokes over the upper left chest as  indicated in your monitor instructions.  Clean area with 4 enclosed alcohol pads. Let dry.  Apply patch as indicated in monitor instructions. Patch will be placed under collarbone on left  side of chest with arrow pointing upward.  Rub patch adhesive wings for 2 minutes. Remove white label marked "1". Remove the white  label marked "2". Rub patch adhesive wings for 2 additional minutes.  While looking in a mirror, press and release button in center of patch. A small  green light will  flash 3-4 times. This will be your only indicator that the monitor has been turned on.  Do not shower for the first 24 hours. You may shower after the first 24 hours.  Press the button if you feel a symptom. You will hear a small click. Record Date, Time and  Symptom in the Patient Logbook.  When you are ready to remove the patch, follow instructions on the last 2 pages of Patient  Logbook. Stick patch monitor onto the last page of Patient Logbook.  Place Patient Logbook in the blue and white box. Use locking tab on box and tape box closed  securely. The blue and white box has prepaid postage on it. Please place it in the mailbox as  soon as possible. Your physician should have your test results approximately 7 days after the  monitor has been mailed back to [facility].  Call [facility] Customer Care at [phone] if you have questions regarding  your ZIO XT patch monitor. Call them immediately if you see an orange light blinking on your  monitor.  If your monitor falls off in less than 4 days, contact our Monitor department at [phone].  If your monitor becomes loose or falls off after 4 days call [employer] at [phone] for  suggestions on securing your monitor    Follow-Up:  AS NEEDED WITH DR. Shari Prows OR KARDIE TOBB DO HERE AT [facility]       Dispo:  No follow-ups on file.   Medication Adjustments/Labs and Tests Ordered: Current medicines are reviewed at length with the patient today.  Concerns regarding medicines are outlined above.  Tests Ordered: Orders Placed This Encounter  Procedures   LONG TERM MONITOR (3-14 DAYS)   EKG 12-Lead   Medication Changes: No orders of the defined types were placed in this encounter.

## 2021-08-14 ENCOUNTER — Ambulatory Visit (INDEPENDENT_AMBULATORY_CARE_PROVIDER_SITE_OTHER): Payer: Medicaid Other

## 2021-08-14 ENCOUNTER — Ambulatory Visit (INDEPENDENT_AMBULATORY_CARE_PROVIDER_SITE_OTHER): Payer: Medicaid Other | Admitting: Cardiology

## 2021-08-14 ENCOUNTER — Encounter: Payer: Self-pay | Admitting: Cardiology

## 2021-08-14 ENCOUNTER — Other Ambulatory Visit: Payer: Self-pay

## 2021-08-14 VITALS — BP 116/60 | HR 100 | Ht 65.0 in | Wt 200.6 lb

## 2021-08-14 DIAGNOSIS — R002 Palpitations: Secondary | ICD-10-CM

## 2021-08-14 NOTE — Patient Instructions (Signed)
Medication Instructions:  ? ?Your physician recommends that you continue on your current medications as directed. Please refer to the Current Medication list given to you today. ? ?*If you need a refill on your cardiac medications before your next appointment, please call your pharmacy* ? ? ?Testing/Procedures: ? ?ZIO XT- Long Term Monitor Instructions ? ?Your physician has requested you wear a ZIO patch monitor for 7 days.  ?This is a single patch monitor. [employer] supplies one patch monitor per enrollment. Additional ?stickers are not available. Please do not apply patch if you will be having a Nuclear Stress Test,  ?Echocardiogram, Cardiac CT, MRI, or Chest Xray during the period you would be wearing the  ?monitor. The patch cannot be worn during these tests. You cannot remove and re-apply the  ?ZIO XT patch monitor.  ?Your ZIO patch monitor will be mailed 3 day USPS to your address on file. It may take 3-5 days  ?to receive your monitor after you have been enrolled.  ?Once you have received your monitor, please review the enclosed instructions. Your monitor  ?has already been registered assigning a specific monitor serial # to you. ? ?Billing and Patient Assistance Program Information ? ?We have supplied [employer] with any of your insurance information on file for billing purposes. ?[employer] offers a sliding scale Patient Assistance Program for patients that do not have  ?insurance, or whose insurance does not completely cover the cost of the ZIO monitor.  ?You must apply for the Patient Assistance Program to qualify for this discounted rate.  ?To apply, please call [employer] at [phone], select option 4, select option 2, ask to apply for  ?Patient Assistance Program. Meredeth Ide will ask your household income, and how many people  ?are in your household. They will quote your out-of-pocket cost based on that information.  ?[employer] will also be able to set up a 33-month, interest-free payment plan if  needed. ? ?Applying the monitor ?  ?Shave hair from upper left chest.  ?Hold abrader disc by orange tab. Rub abrader in 40 strokes over the upper left chest as  ?indicated in your monitor instructions.  ?Clean area with 4 enclosed alcohol pads. Let dry.  ?Apply patch as indicated in monitor instructions. Patch will be placed under collarbone on left  ?side of chest with arrow pointing upward.  ?Rub patch adhesive wings for 2 minutes. Remove white label marked "1". Remove the white  ?label marked "2". Rub patch adhesive wings for 2 additional minutes.  ?While looking in a mirror, press and release button in center of patch. A small green light will  ?flash 3-4 times. This will be your only indicator that the monitor has been turned on.  ?Do not shower for the first 24 hours. You may shower after the first 24 hours.  ?Press the button if you feel a symptom. You will hear a small click. Record Date, Time and  ?Symptom in the Patient Logbook.  ?When you are ready to remove the patch, follow instructions on the last 2 pages of Patient  ?Logbook. Stick patch monitor onto the last page of Patient Logbook.  ?Place Patient Logbook in the blue and white box. Use locking tab on box and tape box closed  ?securely. The blue and white box has prepaid postage on it. Please place it in the mailbox as  ?soon as possible. Your physician should have your test results approximately 7 days after the  ?monitor has been mailed back to [facility].  ?Call [employer]  Customer Care at [phone] if you have questions regarding  ?your ZIO XT patch monitor. Call them immediately if you see an orange light blinking on your  ?monitor.  ?If your monitor falls off in less than 4 days, contact our Monitor department at [phone].  ?If your monitor becomes loose or falls off after 4 days call [employer] at [phone] for  ?suggestions on securing your monitor ? ? ? ?Follow-Up: ? ?AS NEEDED WITH DR. Shari Prows OR KARDIE TOBB DO HERE AT  [facility]  ? ? ? ?

## 2021-08-14 NOTE — Progress Notes (Unsigned)
Enrolled patient for a 7 day Zio XT monitor to be mailed to patients home.  

## 2021-08-17 DIAGNOSIS — R002 Palpitations: Secondary | ICD-10-CM

## 2021-08-31 ENCOUNTER — Ambulatory Visit (HOSPITAL_BASED_OUTPATIENT_CLINIC_OR_DEPARTMENT_OTHER): Payer: Medicaid Other | Admitting: Obstetrics

## 2021-08-31 ENCOUNTER — Ambulatory Visit: Payer: Medicaid Other | Attending: Family Medicine

## 2021-08-31 ENCOUNTER — Encounter: Payer: Self-pay | Admitting: *Deleted

## 2021-08-31 ENCOUNTER — Ambulatory Visit: Payer: Medicaid Other | Admitting: *Deleted

## 2021-08-31 ENCOUNTER — Other Ambulatory Visit: Payer: Self-pay

## 2021-08-31 ENCOUNTER — Other Ambulatory Visit: Payer: Self-pay | Admitting: *Deleted

## 2021-08-31 VITALS — BP 109/44 | HR 84

## 2021-08-31 DIAGNOSIS — O30042 Twin pregnancy, dichorionic/diamniotic, second trimester: Secondary | ICD-10-CM

## 2021-08-31 DIAGNOSIS — O99212 Obesity complicating pregnancy, second trimester: Secondary | ICD-10-CM

## 2021-08-31 DIAGNOSIS — Z3A19 19 weeks gestation of pregnancy: Secondary | ICD-10-CM

## 2021-08-31 DIAGNOSIS — O30001 Twin pregnancy, unspecified number of placenta and unspecified number of amniotic sacs, first trimester: Secondary | ICD-10-CM

## 2021-08-31 DIAGNOSIS — E669 Obesity, unspecified: Secondary | ICD-10-CM | POA: Diagnosis not present

## 2021-08-31 DIAGNOSIS — Z3480 Encounter for supervision of other normal pregnancy, unspecified trimester: Secondary | ICD-10-CM | POA: Diagnosis not present

## 2021-08-31 NOTE — Progress Notes (Signed)
MFM Note ? ?Megan Shannon was seen due to a spontaneously conceived twin pregnancy and maternal obesity with a BMI of 33.   ? ?She denies any significant past medical history.  The patient was evaluated by cardiology earlier this month due to complaints of palpitations.  She had a ZIO XT long-term monitor placed last week to assess these palpitations.  We are awaiting the results from this monitoring test. ? ?She had a cell free DNA test earlier in her pregnancy which indicated a low risk for trisomy 66, 39, and 13.  These are predicted to be dizygotic/fraternal twins.  The patient did not want the fetal gender revealed today. ? ?A thick dividing membrane was noted separating the two fetuses, indicating that these are dichorionic, diamniotic twins. ? ?The fetal growth and amniotic fluid level appeared appropriate for both twin A and twin B.   ? ?There were no obvious anomalies noted in either twin A or twin B today.  However the views of the fetal anatomy were limited today due to the fetal positions. ? ?The limitations of ultrasound in the detection of all anomalies was discussed today. ? ?The management of dichorionic twins was discussed.  She was advised that management of twin pregnancies will involve frequent ultrasound exams to assess the fetal growth and amniotic fluid level.  ? ?We will continue to follow her with serial growth ultrasounds.  Weekly fetal testing for dichorionic twins should start at around 36 weeks.   ? ?Delivery for uncomplicated dichorionic twins should occur at around 38 weeks. ? ?The increased risk of preeclampsia, gestational diabetes, and preterm birth/labor associated with twin pregnancies was discussed.  She was advised that she will continue to be followed closely to assess for these conditions.  ? ?As pregnancies with multiple gestations are at increased risk for developing preeclampsia, she was advised to continue taking a daily baby aspirin (81 mg per day) to decrease  her risk of developing preeclampsia.  ? ?A follow-up exam was scheduled in 4 weeks to assess the fetal growth and to complete the views of the fetal anatomy.  ? ?The patient stated that all of her questions were answered today. ? ?A total of 30 minutes was spent counseling and coordinating the care for this patient.  Greater than 50% of the time was spent in direct face-to-face contact. ? ? ?

## 2021-09-04 ENCOUNTER — Encounter: Payer: Self-pay | Admitting: Obstetrics and Gynecology

## 2021-09-04 ENCOUNTER — Ambulatory Visit (INDEPENDENT_AMBULATORY_CARE_PROVIDER_SITE_OTHER): Payer: Medicaid Other | Admitting: Obstetrics and Gynecology

## 2021-09-04 ENCOUNTER — Other Ambulatory Visit: Payer: Self-pay

## 2021-09-04 VITALS — BP 108/63 | HR 85 | Wt 197.7 lb

## 2021-09-04 DIAGNOSIS — R7303 Prediabetes: Secondary | ICD-10-CM

## 2021-09-04 DIAGNOSIS — Z3480 Encounter for supervision of other normal pregnancy, unspecified trimester: Secondary | ICD-10-CM

## 2021-09-04 DIAGNOSIS — O30041 Twin pregnancy, dichorionic/diamniotic, first trimester: Secondary | ICD-10-CM

## 2021-09-04 NOTE — Progress Notes (Signed)
? ?  PRENATAL VISIT NOTE ? ?Subjective:  ?Megan Shannon is a 26 y.o. G3P2002 at [redacted]w[redacted]d being seen today for ongoing prenatal care.  She is currently monitored for the following issues for this high-risk pregnancy and has Supervision of other normal pregnancy, antepartum; Twin gestation in first trimester; and Prediabetes on their problem list. ? ?Patient reports no complaints.  Contractions: Not present. Vag. Bleeding: None.  Movement: Present. Denies leaking of fluid.  ? ?The following portions of the patient's history were reviewed and updated as appropriate: allergies, current medications, past family history, past medical history, past social history, past surgical history and problem list.  ? ?Objective:  ? ?Vitals:  ? 09/04/21 1133  ?BP: 108/63  ?Pulse: 85  ?Weight: 197 lb 11.2 oz (89.7 kg)  ? ? ?Fetal Status: Fetal Heart Rate (bpm): 150/156   Movement: Present    ? ?General:  Alert, oriented and cooperative. Patient is in no acute distress.  ?Skin: Skin is warm and dry. No rash noted.   ?Cardiovascular: Normal heart rate noted  ?Respiratory: Normal respiratory effort, no problems with respiration noted  ?Abdomen: Soft, gravid, appropriate for gestational age.  Pain/Pressure: Absent     ?Pelvic: Cervical exam deferred        ?Extremities: Normal range of motion.  Edema: None  ?Mental Status: Normal mood and affect. Normal behavior. Normal judgment and thought content.  ? ?Assessment and Plan:  ?Pregnancy: [id number] at [redacted]w[redacted]d ?1. Supervision of other normal pregnancy, antepartum ?Patient is doing well without complaints ?Repeat urine culture today ? ?2. Dichorionic diamniotic twin pregnancy in first trimester ?Incomplete ultrasound- follow up anatomy and growth scheduled ? ?3. Prediabetes ?Normal early A1C ? ?Preterm labor symptoms and general obstetric precautions including but not limited to vaginal bleeding, contractions, leaking of fluid and fetal movement were reviewed in detail with the  patient. ?Please refer to After Visit Summary for other counseling recommendations.  ? ?Return in about 4 weeks (around 10/02/2021) for in person, ROB, High risk. ? ?Future Appointments  ?Date Time Provider [place]  ?09/30/2021  2:45 PM [facility] NURSE [facility]  ?09/30/2021  3:00 PM [facility] US1 [facility]  ? ? ?Mora Bellman, MD ? ?

## 2021-09-06 LAB — URINE CULTURE, OB REFLEX

## 2021-09-06 LAB — CULTURE, OB URINE

## 2021-09-30 ENCOUNTER — Ambulatory Visit: Payer: Medicaid Other | Attending: Obstetrics

## 2021-09-30 ENCOUNTER — Other Ambulatory Visit: Payer: Self-pay | Admitting: *Deleted

## 2021-09-30 ENCOUNTER — Ambulatory Visit: Payer: Medicaid Other | Admitting: *Deleted

## 2021-09-30 ENCOUNTER — Ambulatory Visit (INDEPENDENT_AMBULATORY_CARE_PROVIDER_SITE_OTHER): Payer: Medicaid Other | Admitting: Obstetrics & Gynecology

## 2021-09-30 VITALS — BP 102/75 | HR 108 | Wt 204.7 lb

## 2021-09-30 VITALS — BP 115/63 | HR 98

## 2021-09-30 DIAGNOSIS — O99212 Obesity complicating pregnancy, second trimester: Secondary | ICD-10-CM | POA: Diagnosis not present

## 2021-09-30 DIAGNOSIS — O30042 Twin pregnancy, dichorionic/diamniotic, second trimester: Secondary | ICD-10-CM

## 2021-09-30 DIAGNOSIS — O30049 Twin pregnancy, dichorionic/diamniotic, unspecified trimester: Secondary | ICD-10-CM

## 2021-09-30 DIAGNOSIS — Z3A23 23 weeks gestation of pregnancy: Secondary | ICD-10-CM | POA: Insufficient documentation

## 2021-09-30 DIAGNOSIS — E669 Obesity, unspecified: Secondary | ICD-10-CM | POA: Diagnosis not present

## 2021-09-30 DIAGNOSIS — Z3480 Encounter for supervision of other normal pregnancy, unspecified trimester: Secondary | ICD-10-CM

## 2021-09-30 NOTE — Progress Notes (Signed)
? ?  PRENATAL VISIT NOTE ? ?Subjective:  ?Megan Shannon is a 26 y.o. G3P2002 at [redacted]w[redacted]d being seen today for ongoing prenatal care.  She is currently monitored for the following issues for this high-risk pregnancy and has Supervision of other normal pregnancy, antepartum; Twin gestation, dichorionic diamniotic; and Prediabetes on their problem list. ? ?Patient reports no complaints.  Contractions: Not present. Vag. Bleeding: None.  Movement: Present. Denies leaking of fluid.  ? ?The following portions of the patient's history were reviewed and updated as appropriate: allergies, current medications, past family history, past medical history, past social history, past surgical history and problem list.  ? ?Objective:  ? ?Vitals:  ? 09/30/21 1322  ?BP: 102/75  ?Pulse: (!) 108  ?Weight: 204 lb 11.2 oz (92.9 kg)  ? ? ?Fetal Status: Fetal Heart Rate (bpm): 142/155   Movement: Present    ? ?General:  Alert, oriented and cooperative. Patient is in no acute distress.  ?Skin: Skin is warm and dry. No rash noted.   ?Cardiovascular: Normal heart rate noted  ?Respiratory: Normal respiratory effort, no problems with respiration noted  ?Abdomen: Soft, gravid, appropriate for gestational age.  Pain/Pressure: Absent     ?Pelvic: Cervical exam deferred        ?Extremities: Normal range of motion.  Edema: None  ?Mental Status: Normal mood and affect. Normal behavior. Normal judgment and thought content.  ? ?Assessment and Plan:  ?Pregnancy: G3P2002 at [redacted]w[redacted]d ?1. Supervision of other normal pregnancy, antepartum ?AFP today, genetic screen prior was normal ? ?2. Dichorionic diamniotic twin pregnancy, antepartum ?F/u US ?- AFP, Serum, Open Spina Bifida ? ?Preterm labor symptoms and general obstetric precautions including but not limited to vaginal bleeding, contractions, leaking of fluid and fetal movement were reviewed in detail with the patient. ?Please refer to After Visit Summary for other counseling recommendations.  ? ?Return  in about 4 weeks (around 10/28/2021). ? ?Future Appointments  ?Date Time Provider [place]  ?09/30/2021  2:30 PM [facility] NURSE [facility]  ?09/30/2021  2:45 PM [facility] US4 [facility]  ? ? ?Emeterio Reeve, MD ?

## 2021-10-01 ENCOUNTER — Encounter: Payer: Medicaid Other | Admitting: Obstetrics & Gynecology

## 2021-10-02 ENCOUNTER — Other Ambulatory Visit: Payer: Self-pay | Admitting: *Deleted

## 2021-10-02 DIAGNOSIS — O99212 Obesity complicating pregnancy, second trimester: Secondary | ICD-10-CM

## 2021-10-02 DIAGNOSIS — O30042 Twin pregnancy, dichorionic/diamniotic, second trimester: Secondary | ICD-10-CM

## 2021-10-02 LAB — AFP, SERUM, OPEN SPINA BIFIDA
AFP MoM: 1.76
AFP Value: 129.7 ng/mL
Gest. Age on Collection Date: 23.2 weeks
Maternal Age At EDD: 28 yr
OSBR Risk 1 IN: 2811
Test Results:: NEGATIVE
Weight: 204 [lb_av]

## 2021-10-21 ENCOUNTER — Encounter: Payer: Self-pay | Admitting: Obstetrics & Gynecology

## 2021-10-28 ENCOUNTER — Other Ambulatory Visit: Payer: Medicaid Other

## 2021-10-28 ENCOUNTER — Encounter: Payer: Self-pay | Admitting: Family Medicine

## 2021-11-02 ENCOUNTER — Other Ambulatory Visit: Payer: Medicaid Other

## 2021-11-02 ENCOUNTER — Ambulatory Visit: Payer: Medicaid Other | Attending: Obstetrics

## 2021-11-02 ENCOUNTER — Ambulatory Visit: Payer: Medicaid Other | Admitting: *Deleted

## 2021-11-02 ENCOUNTER — Other Ambulatory Visit: Payer: Self-pay | Admitting: *Deleted

## 2021-11-02 ENCOUNTER — Ambulatory Visit (INDEPENDENT_AMBULATORY_CARE_PROVIDER_SITE_OTHER): Payer: Medicaid Other | Admitting: Family Medicine

## 2021-11-02 ENCOUNTER — Encounter: Payer: Self-pay | Admitting: *Deleted

## 2021-11-02 ENCOUNTER — Other Ambulatory Visit: Payer: Self-pay

## 2021-11-02 VITALS — BP 119/74 | HR 94 | Wt 202.1 lb

## 2021-11-02 VITALS — BP 118/60 | HR 95

## 2021-11-02 DIAGNOSIS — O99213 Obesity complicating pregnancy, third trimester: Secondary | ICD-10-CM | POA: Diagnosis not present

## 2021-11-02 DIAGNOSIS — Z3A28 28 weeks gestation of pregnancy: Secondary | ICD-10-CM

## 2021-11-02 DIAGNOSIS — Z23 Encounter for immunization: Secondary | ICD-10-CM

## 2021-11-02 DIAGNOSIS — O30042 Twin pregnancy, dichorionic/diamniotic, second trimester: Secondary | ICD-10-CM | POA: Diagnosis present

## 2021-11-02 DIAGNOSIS — Z3480 Encounter for supervision of other normal pregnancy, unspecified trimester: Secondary | ICD-10-CM

## 2021-11-02 DIAGNOSIS — O30043 Twin pregnancy, dichorionic/diamniotic, third trimester: Secondary | ICD-10-CM

## 2021-11-02 DIAGNOSIS — O99212 Obesity complicating pregnancy, second trimester: Secondary | ICD-10-CM | POA: Insufficient documentation

## 2021-11-02 DIAGNOSIS — R7303 Prediabetes: Secondary | ICD-10-CM

## 2021-11-02 DIAGNOSIS — R638 Other symptoms and signs concerning food and fluid intake: Secondary | ICD-10-CM

## 2021-11-02 DIAGNOSIS — E669 Obesity, unspecified: Secondary | ICD-10-CM

## 2021-11-02 NOTE — Patient Instructions (Signed)

## 2021-11-02 NOTE — Progress Notes (Signed)
   PRENATAL VISIT NOTE  Subjective:  Megan Shannon is a 26 y.o. G3P2002 at [redacted]w[redacted]d being seen today for ongoing prenatal care.  She is currently monitored for the following issues for this high-risk pregnancy and has Supervision of other normal pregnancy, antepartum; Twin gestation, dichorionic diamniotic; and Prediabetes on their problem list.  Patient reports  GI upset with diarrhea on occasion, carpal tunnel sx's .  Contractions: Not present. Vag. Bleeding: None.  Movement: Present. Denies leaking of fluid.   The following portions of the patient's history were reviewed and updated as appropriate: allergies, current medications, past family history, past medical history, past social history, past surgical history and problem list.   Objective:   Vitals:   11/02/21 1000  BP: 119/74  Pulse: 94  Weight: 202 lb 1.6 oz (91.7 kg)    Fetal Status: Fetal Heart Rate (bpm): 140/144   Movement: Present     General:  Alert, oriented and cooperative. Patient is in no acute distress.  Skin: Skin is warm and dry. No rash noted.   Cardiovascular: Normal heart rate noted  Respiratory: Normal respiratory effort, no problems with respiration noted  Abdomen: Soft, gravid, appropriate for gestational age.  Pain/Pressure: Absent     Pelvic: Cervical exam deferred        Extremities: Normal range of motion.  Edema: None  Mental Status: Normal mood and affect. Normal behavior. Normal judgment and thought content.   Assessment and Plan:  Pregnancy: G3P2002 at [redacted]w[redacted]d 1. Supervision of other normal pregnancy, antepartum Cock up wrist splints at night 28 wk labs today Food diary - Tdap vaccine greater than or equal to 7yo IM  2. Dichorionic diamniotic twin pregnancy in third trimester Concordant growth today A 2 lb 9 oz, vtx, B 2 lb 14 oz, breech  3. Prediabetes A1C 5.2 initially 2 hour today  Preterm labor symptoms and general obstetric precautions including but not limited to vaginal  bleeding, contractions, leaking of fluid and fetal movement were reviewed in detail with the patient. Please refer to After Visit Summary for other counseling recommendations.   Return in 2 weeks (on 11/16/2021).  Future Appointments  Date Time Provider [place]  11/16/2021  2:15 PM Woodroe Mode, MD [facility]  11/30/2021  2:15 PM Woodroe Mode, MD [facility]  12/01/2021  8:15 AM [facility] NURSE [facility]  12/01/2021  8:30 AM [facility] US2 [facility]    Donnamae Jude, MD

## 2021-11-03 ENCOUNTER — Telehealth: Payer: Self-pay

## 2021-11-03 LAB — GLUCOSE TOLERANCE, 2 HOURS W/ 1HR
Glucose, 1 hour: 194 mg/dL — ABNORMAL HIGH (ref 70–179)
Glucose, 2 hour: 116 mg/dL (ref 70–152)
Glucose, Fasting: 85 mg/dL (ref 70–91)

## 2021-11-03 LAB — CBC
Hematocrit: 34.2 % (ref 34.0–46.6)
Hemoglobin: 11.6 g/dL (ref 11.1–15.9)
MCH: 29.7 pg (ref 26.6–33.0)
MCHC: 33.9 g/dL (ref 31.5–35.7)
MCV: 88 fL (ref 79–97)
Platelets: 118 10*3/uL — ABNORMAL LOW (ref 150–450)
RBC: 3.91 x10E6/uL (ref 3.77–5.28)
RDW: 13 % (ref 11.7–15.4)
WBC: 6.9 10*3/uL (ref 3.4–10.8)

## 2021-11-03 LAB — RPR: RPR Ser Ql: NONREACTIVE

## 2021-11-03 LAB — HIV ANTIBODY (ROUTINE TESTING W REFLEX): HIV Screen 4th Generation wRfx: NONREACTIVE

## 2021-11-03 MED ORDER — GLUCOSE BLOOD VI STRP: ORAL_STRIP | 12 refills | Status: DC

## 2021-11-03 MED ORDER — ACCU-CHEK GUIDE W/DEVICE KIT: 1.0000 | PACK | Freq: Four times a day (QID) | 0 refills | Status: DC

## 2021-11-03 MED ORDER — ACCU-CHEK SOFTCLIX LANCETS MISC: 12 refills | Status: DC

## 2021-11-03 NOTE — Telephone Encounter (Signed)
-----   Message from Milas Hock, MD sent at 11/03/2021  9:45 AM EDT ----- She needs DM supplies and referral to education with Marylene Land. Thanks! pad

## 2021-11-03 NOTE — Telephone Encounter (Signed)
Call placed to pt. No answer. Left VM with pt about results and return call to office to advise on GDM supplies and appt with Levada Dy. Pt also sent mychart message.  Shelda Pal

## 2021-11-03 NOTE — Addendum Note (Signed)
Addended by: Georgia Lopes on: 11/03/2021 05:21 PM   Modules accepted: Orders

## 2021-11-10 ENCOUNTER — Other Ambulatory Visit: Payer: Self-pay

## 2021-11-10 ENCOUNTER — Encounter: Payer: Self-pay | Admitting: Family Medicine

## 2021-11-10 DIAGNOSIS — Z3480 Encounter for supervision of other normal pregnancy, unspecified trimester: Secondary | ICD-10-CM

## 2021-11-10 DIAGNOSIS — L299 Pruritus, unspecified: Secondary | ICD-10-CM

## 2021-11-11 ENCOUNTER — Encounter: Payer: Medicaid Other | Admitting: Family Medicine

## 2021-11-11 ENCOUNTER — Other Ambulatory Visit: Payer: Medicaid Other

## 2021-11-11 DIAGNOSIS — L299 Pruritus, unspecified: Secondary | ICD-10-CM

## 2021-11-11 DIAGNOSIS — Z3480 Encounter for supervision of other normal pregnancy, unspecified trimester: Secondary | ICD-10-CM

## 2021-11-12 ENCOUNTER — Encounter: Payer: Medicaid Other | Attending: Family Medicine | Admitting: Registered"

## 2021-11-12 ENCOUNTER — Ambulatory Visit (INDEPENDENT_AMBULATORY_CARE_PROVIDER_SITE_OTHER): Payer: Medicaid Other | Admitting: Registered"

## 2021-11-12 DIAGNOSIS — Z713 Dietary counseling and surveillance: Secondary | ICD-10-CM | POA: Diagnosis present

## 2021-11-12 DIAGNOSIS — Z8632 Personal history of gestational diabetes: Secondary | ICD-10-CM | POA: Insufficient documentation

## 2021-11-12 DIAGNOSIS — O24410 Gestational diabetes mellitus in pregnancy, diet controlled: Secondary | ICD-10-CM | POA: Insufficient documentation

## 2021-11-12 DIAGNOSIS — O24419 Gestational diabetes mellitus in pregnancy, unspecified control: Secondary | ICD-10-CM

## 2021-11-12 HISTORY — DX: Personal history of gestational diabetes: Z86.32

## 2021-11-12 NOTE — Progress Notes (Signed)
Patient was seen for Gestational Diabetes (twin gestation, dichorionic diamniotic) self-management on 11/12/21.  Start time 1315 and End time 1417   Estimated due date: 01/25/22; [redacted]w[redacted]d  Clinical: Medications: reviewed Medical History: reviewed Labs: OGTT 1-hr 194(H), A1c 5.2%   Dietary and Lifestyle History: Pt states she has never been a breakfast person. Pt states her schedule now is waking up 6:20 to get kids ready for school, eats around 9 am, sometimes eats over several hours instead of a meal.  Pt states she was eating a lot of fruit prior to finding out about GDM diagnosis, and has since cut back.  Pt states she enjoys a variety of foods.  Physical Activity: ADL Stress: not assessed Sleep: 7-8 hrs  24 hr Recall:  First Meal: oatmeal or smoothies(usually doesn't eat breakfast) Snack: granola bars or cutie Second meal: ham, cheese, lettuce and tomato, pickle, zero soda Snack: cutie Third meal: pasta, chicken, mixed veggies  Snack: none (sometimes fruit) Beverages: water, coffee 1-2x/week, ice water  NUTRITION INTERVENTION  Nutrition education (E-1) on the following topics:   Initial Follow-up  [x]  []  Definition of Gestational Diabetes [x]  []  Why dietary management is important in controlling blood glucose [x]  []  Effects each nutrient has on blood glucose levels [x]  []  Simple carbohydrates vs complex carbohydrates [x]  []  Fluid intake [x]  []  Creating a balanced meal plan [x]  []  Carbohydrate counting  [x]  []  When to check blood glucose levels [x]  []  Proper blood glucose monitoring techniques [x]  []  Effect of stress and stress reduction techniques  [x]  []  Exercise effect on blood glucose levels, appropriate exercise during pregnancy [x]  []  Importance of limiting caffeine and abstaining from alcohol and smoking [x]  []  Medications used for blood sugar control during pregnancy [x]  []  Hypoglycemia and rule of 15 [x]  []  Postpartum self care  Patient brought meter to  visit for training: CBG: 98 mg/dL   Patient instructed to monitor glucose levels: FBS: 60 - ? 95 mg/dL (some clinics use 90 for cutoff) 1 hour: ? 140 mg/dL 2 hour: ? mg/dL  Patient received handouts: Nutrition Diabetes and Pregnancy Carbohydrate Counting List  Patient will be seen for follow-up as needed.

## 2021-11-13 ENCOUNTER — Encounter: Payer: Self-pay | Admitting: Family Medicine

## 2021-11-13 LAB — BILE ACIDS, TOTAL: Bile Acids Total: 7.7 umol/L (ref 0.0–10.0)

## 2021-11-13 LAB — COMPREHENSIVE METABOLIC PANEL
ALT: 43 IU/L — ABNORMAL HIGH (ref 0–32)
AST: 27 IU/L (ref 0–40)
Albumin/Globulin Ratio: 1.2 (ref 1.2–2.2)
Albumin: 3.4 g/dL — ABNORMAL LOW (ref 3.9–5.0)
Alkaline Phosphatase: 166 IU/L — ABNORMAL HIGH (ref 44–121)
BUN/Creatinine Ratio: 13 (ref 9–23)
BUN: 6 mg/dL (ref 6–20)
Bilirubin Total: 0.2 mg/dL (ref 0.0–1.2)
CO2: 18 mmol/L — ABNORMAL LOW (ref 20–29)
Calcium: 9 mg/dL (ref 8.7–10.2)
Chloride: 103 mmol/L (ref 96–106)
Creatinine, Ser: 0.45 mg/dL — ABNORMAL LOW (ref 0.57–1.00)
Globulin, Total: 2.8 g/dL (ref 1.5–4.5)
Glucose: 81 mg/dL (ref 70–99)
Potassium: 4 mmol/L (ref 3.5–5.2)
Sodium: 136 mmol/L (ref 134–144)
Total Protein: 6.2 g/dL (ref 6.0–8.5)
eGFR: 135 mL/min/{1.73_m2} (ref >59)

## 2021-11-16 ENCOUNTER — Ambulatory Visit (INDEPENDENT_AMBULATORY_CARE_PROVIDER_SITE_OTHER): Payer: Medicaid Other | Admitting: Obstetrics & Gynecology

## 2021-11-16 VITALS — BP 112/71 | HR 93 | Wt 204.2 lb

## 2021-11-16 DIAGNOSIS — O30043 Twin pregnancy, dichorionic/diamniotic, third trimester: Secondary | ICD-10-CM

## 2021-11-16 DIAGNOSIS — Z3480 Encounter for supervision of other normal pregnancy, unspecified trimester: Secondary | ICD-10-CM

## 2021-11-16 DIAGNOSIS — O24410 Gestational diabetes mellitus in pregnancy, diet controlled: Secondary | ICD-10-CM

## 2021-11-16 DIAGNOSIS — L299 Pruritus, unspecified: Secondary | ICD-10-CM

## 2021-11-16 NOTE — Progress Notes (Signed)
   PRENATAL VISIT NOTE  Subjective:  Megan Shannon is a 26 y.o. G3P2002 at 52w0dbeing seen today for ongoing prenatal care.  She is currently monitored for the following issues for this high-risk pregnancy and has Supervision of other normal pregnancy, antepartum; Twin gestation, dichorionic diamniotic; Prediabetes; and Gestational diabetes mellitus (GDM) in third trimester on their problem list.  Patient reports no complaints. She did have some itching last week but this has improved. Endorses good fetal movement. Contractions: Not present. Vag. Bleeding: None. Movement: Present. Denies leaking of fluid.   The following portions of the patient's history were reviewed and updated as appropriate: allergies, current medications, past family history, past medical history, past social history, past surgical history and problem list.   Objective:   Vitals:   11/16/21 1355  BP: 112/71  Pulse: 93  Weight: 204 lb 3.2 oz (92.6 kg)    Fetal Status:     Movement: Present    General:  Alert, oriented and cooperative. Patient is in no acute distress.  Skin: Skin is warm and dry. No rash noted.   Cardiovascular: Normal heart rate noted  Respiratory: Normal respiratory effort, no problems with respiration noted  Abdomen: Soft, gravid, appropriate for gestational age.  Pain/Pressure: Absent     Pelvic: Cervical exam deferred        Extremities: Normal range of motion.  Edema: None  Mental Status: Normal mood and affect. Normal behavior. Normal judgment and thought content.   Assessment and Plan:  Pregnancy: G3P2002 at 362w0d1. Supervision of other normal pregnancy, antepartum -CMP today to follow up on ALT and Alk Phos elevation  2. Dichorionic diamniotic twin pregnancy in third trimester  3. Diet controlled gestational diabetes mellitus (GDM) in third trimester    Preterm labor symptoms and general obstetric precautions including but not limited to vaginal bleeding,  contractions, leaking of fluid and fetal movement were reviewed in detail with the patient. Please refer to After Visit Summary for other counseling recommendations.   Return in about 2 weeks (around 11/30/2021).  Future Appointments  Date Time Provider [place]/20[place]  2:15 PM ArWoodroe ModeMD [facility]/20/2023  8:15 AM [facility]20[facility]  8:30 AM [facility]  BeLavone NeriStudent-PA 11/16/21 2:18 PM

## 2021-11-17 LAB — COMPREHENSIVE METABOLIC PANEL
ALT: 43 IU/L — ABNORMAL HIGH (ref 0–32)
AST: 25 IU/L (ref 0–40)
Albumin/Globulin Ratio: 1.2 (ref 1.2–2.2)
Albumin: 3.2 g/dL — ABNORMAL LOW (ref 3.9–5.0)
Alkaline Phosphatase: 174 IU/L — ABNORMAL HIGH (ref 44–121)
BUN/Creatinine Ratio: 15 (ref 9–23)
BUN: 6 mg/dL (ref 6–20)
Bilirubin Total: 0.2 mg/dL (ref 0.0–1.2)
CO2: 21 mmol/L (ref 20–29)
Calcium: 9.2 mg/dL (ref 8.7–10.2)
Chloride: 105 mmol/L (ref 96–106)
Creatinine, Ser: 0.4 mg/dL — ABNORMAL LOW (ref 0.57–1.00)
Globulin, Total: 2.7 g/dL (ref 1.5–4.5)
Glucose: 109 mg/dL — ABNORMAL HIGH (ref 70–99)
Potassium: 3.9 mmol/L (ref 3.5–5.2)
Sodium: 139 mmol/L (ref 134–144)
Total Protein: 5.9 g/dL — ABNORMAL LOW (ref 6.0–8.5)
eGFR: 139 mL/min/{1.73_m2} (ref >59)

## 2021-11-23 ENCOUNTER — Encounter: Payer: Self-pay | Admitting: Obstetrics & Gynecology

## 2021-11-25 ENCOUNTER — Telehealth: Payer: Self-pay | Admitting: Family Medicine

## 2021-11-30 ENCOUNTER — Telehealth (INDEPENDENT_AMBULATORY_CARE_PROVIDER_SITE_OTHER): Payer: Medicaid Other | Admitting: Obstetrics & Gynecology

## 2021-11-30 ENCOUNTER — Encounter: Payer: Self-pay | Admitting: Obstetrics & Gynecology

## 2021-11-30 VITALS — BP 125/79 | HR 114

## 2021-11-30 DIAGNOSIS — O24410 Gestational diabetes mellitus in pregnancy, diet controlled: Secondary | ICD-10-CM

## 2021-11-30 DIAGNOSIS — Z3A32 32 weeks gestation of pregnancy: Secondary | ICD-10-CM

## 2021-11-30 DIAGNOSIS — O30043 Twin pregnancy, dichorionic/diamniotic, third trimester: Secondary | ICD-10-CM

## 2021-11-30 DIAGNOSIS — Z3480 Encounter for supervision of other normal pregnancy, unspecified trimester: Secondary | ICD-10-CM

## 2021-11-30 MED ORDER — OMEPRAZOLE 40 MG PO CPDR: 40.0000 mg | DELAYED_RELEASE_CAPSULE | Freq: Every day | ORAL | 2 refills | Status: DC

## 2021-11-30 NOTE — Progress Notes (Signed)
I connected with Megan Shannon 11/30/21 at  2:15 PM EDT by: MyChart video and verified that I am speaking with the correct person using two identifiers.  Patient is located at home and provider is located at [facility].     I discussed the limitations, risks, security and privacy concerns of performing an evaluation and management service by MyChart video and the availability of in person appointments. I also discussed with the patient that there may be a patient responsible charge related to this service. By engaging in this virtual visit, you consent to the provision of healthcare.  Additionally, you authorize for your insurance to be billed for the services provided during this visit.  The patient expressed understanding and agreed to proceed.  The following staff members participated in the virtual visit:      PRENATAL VISIT NOTE  Subjective:  Megan Shannon is a 26 y.o. G3P2002 at [redacted]w[redacted]d  for virtual visit for ongoing prenatal care.  She is currently monitored for the following issues for this high-risk pregnancy and has Supervision of other normal pregnancy, antepartum; Twin gestation, dichorionic diamniotic; Prediabetes; and Gestational diabetes mellitus (GDM) in third trimester on their problem list.  Patient reports heartburn, nausea, and itching .  Contractions: Not present. Vag. Bleeding: None.  Movement: Present. Denies leaking of fluid.   The following portions of the patient's history were reviewed and updated as appropriate: allergies, current medications, past family history, past medical history, past social history, past surgical history and problem list.   Objective:   Vitals:   11/30/21 1411  BP: 125/79  Pulse: (!) 114   Self-Obtained  Fetal Status:     Movement: Present     Assessment and Plan:  Pregnancy: G3P2002 at [redacted]w[redacted]d 1. Supervision of other normal pregnancy, antepartum C/o itching, r/o cholestasis - omeprazole (PRILOSEC) 40 MG capsule; Take 1  capsule (40 mg total) by mouth daily.  Dispense: 30 capsule; Refill: 2 - Bile acids, total  2. Dichorionic diamniotic twin pregnancy in third trimester F/u MFC tomorrow  3. Diet controlled gestational diabetes mellitus (GDM) in third trimester FBS 90% in range same with PP  Preterm labor symptoms and general obstetric precautions including but not limited to vaginal bleeding, contractions, leaking of fluid and fetal movement were reviewed in detail with the patient.  Return in about 2 weeks (around 12/14/2021).  Future Appointments  Date Time Provider Department Center  12/01/2021  8:15 AM [facility] NURSE [facility]  12/01/2021  8:30 AM [facility] US2 [facility]  12/14/2021  2:15 PM Reva Bores, MD [facility]  12/28/2021  1:35 PM Adam Phenix, MD [facility]  01/04/2022  1:35 PM Warden Fillers, MD [facility]  01/11/2022 11:15 AM Adam Phenix, MD [facility]     Time spent on virtual visit: 12 minutes  Scheryl Darter, MD

## 2021-11-30 NOTE — Progress Notes (Signed)
I connected with  Windy Fast on 11/30/21 at  2:15 PM EDT by telephone and verified that I am speaking with the correct person using two identifiers.   I discussed the limitations, risks, security and privacy concerns of performing an evaluation and management service by telephone and the availability of in person appointments. I also discussed with the patient that there may be a patient responsible charge related to this service. The patient expressed understanding and agreed to proceed.  Reports continued complaint of itching. Reports this has decreased in last 2 weeks. Describes as stomach, back, forearms, and palms of hands.  Marjo Bicker, RN 11/30/2021  2:10 PM

## 2021-12-01 ENCOUNTER — Ambulatory Visit: Payer: Medicaid Other | Admitting: *Deleted

## 2021-12-01 ENCOUNTER — Other Ambulatory Visit: Payer: Medicaid Other

## 2021-12-01 ENCOUNTER — Other Ambulatory Visit: Payer: Self-pay | Admitting: *Deleted

## 2021-12-01 ENCOUNTER — Other Ambulatory Visit: Payer: Self-pay | Admitting: Obstetrics and Gynecology

## 2021-12-01 ENCOUNTER — Ambulatory Visit: Payer: Medicaid Other | Attending: Obstetrics and Gynecology

## 2021-12-01 ENCOUNTER — Other Ambulatory Visit: Payer: Self-pay

## 2021-12-01 ENCOUNTER — Encounter: Payer: Self-pay | Admitting: *Deleted

## 2021-12-01 VITALS — BP 110/63 | HR 91

## 2021-12-01 DIAGNOSIS — O30043 Twin pregnancy, dichorionic/diamniotic, third trimester: Secondary | ICD-10-CM

## 2021-12-01 DIAGNOSIS — O321XX0 Maternal care for breech presentation, not applicable or unspecified: Secondary | ICD-10-CM | POA: Diagnosis not present

## 2021-12-01 DIAGNOSIS — E669 Obesity, unspecified: Secondary | ICD-10-CM

## 2021-12-01 DIAGNOSIS — O99113 Other diseases of the blood and blood-forming organs and certain disorders involving the immune mechanism complicating pregnancy, third trimester: Secondary | ICD-10-CM | POA: Diagnosis not present

## 2021-12-01 DIAGNOSIS — Z3480 Encounter for supervision of other normal pregnancy, unspecified trimester: Secondary | ICD-10-CM

## 2021-12-01 DIAGNOSIS — D696 Thrombocytopenia, unspecified: Secondary | ICD-10-CM

## 2021-12-01 DIAGNOSIS — Z6832 Body mass index (BMI) 32.0-32.9, adult: Secondary | ICD-10-CM

## 2021-12-01 DIAGNOSIS — O24410 Gestational diabetes mellitus in pregnancy, diet controlled: Secondary | ICD-10-CM | POA: Insufficient documentation

## 2021-12-01 DIAGNOSIS — R638 Other symptoms and signs concerning food and fluid intake: Secondary | ICD-10-CM

## 2021-12-01 DIAGNOSIS — Z3A32 32 weeks gestation of pregnancy: Secondary | ICD-10-CM

## 2021-12-01 DIAGNOSIS — O99213 Obesity complicating pregnancy, third trimester: Secondary | ICD-10-CM

## 2021-12-01 NOTE — Addendum Note (Signed)
Addended by: Kathee Delton on: 12/01/2021 10:38 AM   Modules accepted: Orders

## 2021-12-02 LAB — BILE ACIDS, TOTAL: Bile Acids Total: 4.8 umol/L (ref 0.0–10.0)

## 2021-12-08 ENCOUNTER — Other Ambulatory Visit: Payer: Self-pay | Admitting: General Practice

## 2021-12-08 ENCOUNTER — Encounter: Payer: Self-pay | Admitting: Obstetrics & Gynecology

## 2021-12-08 DIAGNOSIS — O99713 Diseases of the skin and subcutaneous tissue complicating pregnancy, third trimester: Secondary | ICD-10-CM

## 2021-12-09 ENCOUNTER — Telehealth: Payer: Self-pay | Admitting: Family Medicine

## 2021-12-09 ENCOUNTER — Other Ambulatory Visit: Payer: Self-pay

## 2021-12-09 ENCOUNTER — Other Ambulatory Visit: Payer: Medicaid Other

## 2021-12-09 DIAGNOSIS — L299 Pruritus, unspecified: Secondary | ICD-10-CM

## 2021-12-11 ENCOUNTER — Other Ambulatory Visit: Payer: Self-pay | Admitting: Family Medicine

## 2021-12-11 ENCOUNTER — Other Ambulatory Visit: Payer: Self-pay | Admitting: *Deleted

## 2021-12-11 DIAGNOSIS — O26643 Intrahepatic cholestasis of pregnancy, third trimester: Secondary | ICD-10-CM

## 2021-12-11 DIAGNOSIS — K831 Obstruction of bile duct: Secondary | ICD-10-CM | POA: Insufficient documentation

## 2021-12-11 DIAGNOSIS — Z8719 Personal history of other diseases of the digestive system: Secondary | ICD-10-CM | POA: Insufficient documentation

## 2021-12-11 HISTORY — DX: Intrahepatic cholestasis of pregnancy, third trimester: O26.643

## 2021-12-11 HISTORY — DX: Obstruction of bile duct: K83.1

## 2021-12-11 LAB — COMPREHENSIVE METABOLIC PANEL
ALT: 103 IU/L — ABNORMAL HIGH (ref 0–32)
AST: 67 IU/L — ABNORMAL HIGH (ref 0–40)
Albumin/Globulin Ratio: 1.3 (ref 1.2–2.2)
Albumin: 3.3 g/dL — ABNORMAL LOW (ref 3.9–5.0)
Alkaline Phosphatase: 259 IU/L — ABNORMAL HIGH (ref 44–121)
BUN/Creatinine Ratio: 8 — ABNORMAL LOW (ref 9–23)
BUN: 4 mg/dL — ABNORMAL LOW (ref 6–20)
Bilirubin Total: 0.5 mg/dL (ref 0.0–1.2)
CO2: 18 mmol/L — ABNORMAL LOW (ref 20–29)
Calcium: 9.2 mg/dL (ref 8.7–10.2)
Chloride: 101 mmol/L (ref 96–106)
Creatinine, Ser: 0.51 mg/dL — ABNORMAL LOW (ref 0.57–1.00)
Globulin, Total: 2.5 g/dL (ref 1.5–4.5)
Glucose: 127 mg/dL — ABNORMAL HIGH (ref 70–99)
Potassium: 3.7 mmol/L (ref 3.5–5.2)
Sodium: 137 mmol/L (ref 134–144)
Total Protein: 5.8 g/dL — ABNORMAL LOW (ref 6.0–8.5)
eGFR: 131 mL/min/{1.73_m2} (ref >59)

## 2021-12-11 LAB — BILE ACIDS, TOTAL: Bile Acids Total: 44.2 umol/L (ref 0.0–10.0)

## 2021-12-11 MED ORDER — HYDROXYZINE HCL 25 MG PO TABS: 25.0000 mg | ORAL_TABLET | Freq: Three times a day (TID) | ORAL | 1 refills | Status: DC | PRN

## 2021-12-11 MED ORDER — URSODIOL 500 MG PO TABS: 500.0000 mg | ORAL_TABLET | Freq: Two times a day (BID) | ORAL | 1 refills | Status: DC

## 2021-12-14 ENCOUNTER — Ambulatory Visit (INDEPENDENT_AMBULATORY_CARE_PROVIDER_SITE_OTHER): Payer: Medicaid Other | Admitting: Family Medicine

## 2021-12-14 ENCOUNTER — Other Ambulatory Visit: Payer: Self-pay

## 2021-12-14 VITALS — BP 117/71 | HR 94 | Wt 206.6 lb

## 2021-12-14 DIAGNOSIS — O24410 Gestational diabetes mellitus in pregnancy, diet controlled: Secondary | ICD-10-CM

## 2021-12-14 DIAGNOSIS — O99119 Other diseases of the blood and blood-forming organs and certain disorders involving the immune mechanism complicating pregnancy, unspecified trimester: Secondary | ICD-10-CM

## 2021-12-14 DIAGNOSIS — O26613 Liver and biliary tract disorders in pregnancy, third trimester: Secondary | ICD-10-CM

## 2021-12-14 DIAGNOSIS — Z3480 Encounter for supervision of other normal pregnancy, unspecified trimester: Secondary | ICD-10-CM

## 2021-12-14 DIAGNOSIS — O26643 Intrahepatic cholestasis of pregnancy, third trimester: Secondary | ICD-10-CM

## 2021-12-14 DIAGNOSIS — O30043 Twin pregnancy, dichorionic/diamniotic, third trimester: Secondary | ICD-10-CM

## 2021-12-14 DIAGNOSIS — K831 Obstruction of bile duct: Secondary | ICD-10-CM

## 2021-12-14 DIAGNOSIS — D696 Thrombocytopenia, unspecified: Secondary | ICD-10-CM

## 2021-12-14 DIAGNOSIS — O99113 Other diseases of the blood and blood-forming organs and certain disorders involving the immune mechanism complicating pregnancy, third trimester: Secondary | ICD-10-CM

## 2021-12-14 DIAGNOSIS — Z6832 Body mass index (BMI) 32.0-32.9, adult: Secondary | ICD-10-CM

## 2021-12-14 NOTE — Progress Notes (Signed)
Did not bring glucose log. Reports fasting BG 90s. 2 HR PP 100-131. Will send MyChart message with numbers.

## 2021-12-15 LAB — CBC
Hematocrit: 37 % (ref 34.0–46.6)
Hemoglobin: 12.5 g/dL (ref 11.1–15.9)
MCH: 29.6 pg (ref 26.6–33.0)
MCHC: 33.8 g/dL (ref 31.5–35.7)
MCV: 88 fL (ref 79–97)
Platelets: 81 10*3/uL — CL (ref 150–450)
RBC: 4.23 x10E6/uL (ref 3.77–5.28)
RDW: 13.3 % (ref 11.7–15.4)
WBC: 6.5 10*3/uL (ref 3.4–10.8)

## 2021-12-15 NOTE — Progress Notes (Signed)
   PRENATAL VISIT NOTE  Subjective:  Megan Shannon is a 26 y.o. G3P2002 at [redacted]w[redacted]d being seen today for ongoing prenatal care.  She is currently monitored for the following issues for this high-risk pregnancy and has Supervision of other normal pregnancy, antepartum; Twin gestation, dichorionic diamniotic; Prediabetes; Gestational diabetes mellitus (GDM) in third trimester; Cholestasis during pregnancy in third trimester; and Thrombocytopenia (HCC) on their problem list.  Patient reports no complaints.  Contractions: Irritability. Vag. Bleeding: None.  Movement: Present. Denies leaking of fluid.   The following portions of the patient's history were reviewed and updated as appropriate: allergies, current medications, past family history, past medical history, past social history, past surgical history and problem list.   Objective:   Vitals:   12/14/21 1436  BP: 117/71  Pulse: 94  Weight: 206 lb 9.6 oz (93.7 kg)    Fetal Status: Fetal Heart Rate (bpm): 130/140   Movement: Present     General:  Alert, oriented and cooperative. Patient is in no acute distress.  Skin: Skin is warm and dry. No rash noted.   Cardiovascular: Normal heart rate noted  Respiratory: Normal respiratory effort, no problems with respiration noted  Abdomen: Soft, gravid, appropriate for gestational age.  Pain/Pressure: Present     Pelvic: Cervical exam deferred        Extremities: Normal range of motion.  Edema: Mild pitting, slight indentation  Mental Status: Normal mood and affect. Normal behavior. Normal judgment and thought content.   Assessment and Plan:  Pregnancy: G3P2002 at [redacted]w[redacted]d 1. Cholestasis during pregnancy in third trimester On Actigall and Vistaril Mildly elevated LFTs Delivery at 37 weeks In antenatal testing  2. Diet controlled gestational diabetes mellitus (GDM) in third trimester No book but reportedly CBGs are WNL Most recent grwoth does not show LGA - [place] MFM FETAL BPP WO NST  ADDL GESTATION  3. Supervision of other normal pregnancy, antepartum   4. Dichorionic diamniotic twin pregnancy in third trimester concordant growth - [place] MFM FETAL BPP WO NST ADDL GESTATION  5. Benign gestational thrombocytopenia in third trimester (HCC) Last plts at 121 - CBC; Future - CBC    Preterm labor symptoms and general obstetric precautions including but not limited to vaginal bleeding, contractions, leaking of fluid and fetal movement were reviewed in detail with the patient. Please refer to After Visit Summary for other counseling recommendations.   Return in 2 weeks (on 12/28/2021).  Future Appointments  Date Time Provider Department Center  12/17/2021 11:15 AM [facility] NURSE [facility]  12/17/2021 11:30 AM [facility] US3 [facility]  12/23/2021  9:45 AM [facility] NURSE [facility]  12/23/2021 10:00 AM [facility] US1 [facility]  12/28/2021  1:35 PM Adam Phenix, MD [facility]  12/30/2021  9:45 AM [facility] NURSE [facility]  12/30/2021 10:00 AM [facility] US1 [facility]  01/04/2022 12:00 AM MC-LD SCHED ROOM MC-INDC None  01/04/2022  1:35 PM Warden Fillers, MD [facility]  01/11/2022 11:15 AM Adam Phenix, MD [facility]    Reva Bores, MD

## 2021-12-16 ENCOUNTER — Telehealth: Payer: Self-pay | Admitting: General Practice

## 2021-12-16 ENCOUNTER — Other Ambulatory Visit: Payer: Self-pay

## 2021-12-16 ENCOUNTER — Other Ambulatory Visit: Payer: Medicaid Other

## 2021-12-16 ENCOUNTER — Ambulatory Visit (INDEPENDENT_AMBULATORY_CARE_PROVIDER_SITE_OTHER): Payer: Medicaid Other

## 2021-12-16 VITALS — BP 109/58 | HR 92 | Wt 209.9 lb

## 2021-12-16 DIAGNOSIS — K831 Obstruction of bile duct: Secondary | ICD-10-CM

## 2021-12-16 DIAGNOSIS — O26613 Liver and biliary tract disorders in pregnancy, third trimester: Secondary | ICD-10-CM

## 2021-12-16 DIAGNOSIS — O0990 Supervision of high risk pregnancy, unspecified, unspecified trimester: Secondary | ICD-10-CM

## 2021-12-16 DIAGNOSIS — Z3480 Encounter for supervision of other normal pregnancy, unspecified trimester: Secondary | ICD-10-CM

## 2021-12-16 DIAGNOSIS — O26643 Intrahepatic cholestasis of pregnancy, third trimester: Secondary | ICD-10-CM

## 2021-12-16 LAB — COMPREHENSIVE METABOLIC PANEL
ALT: 86 IU/L — ABNORMAL HIGH (ref 0–32)
AST: 31 IU/L (ref 0–40)
Albumin/Globulin Ratio: 1.3 (ref 1.2–2.2)
Albumin: 2.9 g/dL — ABNORMAL LOW (ref 3.9–5.0)
Alkaline Phosphatase: 266 IU/L — ABNORMAL HIGH (ref 44–121)
BUN/Creatinine Ratio: 12 (ref 9–23)
BUN: 6 mg/dL (ref 6–20)
Bilirubin Total: 0.2 mg/dL (ref 0.0–1.2)
CO2: 21 mmol/L (ref 20–29)
Calcium: 8.7 mg/dL (ref 8.7–10.2)
Chloride: 108 mmol/L — ABNORMAL HIGH (ref 96–106)
Creatinine, Ser: 0.49 mg/dL — ABNORMAL LOW (ref 0.57–1.00)
Globulin, Total: 2.3 g/dL (ref 1.5–4.5)
Glucose: 100 mg/dL — ABNORMAL HIGH (ref 70–99)
Potassium: 3.7 mmol/L (ref 3.5–5.2)
Sodium: 137 mmol/L (ref 134–144)
Total Protein: 5.2 g/dL — ABNORMAL LOW (ref 6.0–8.5)
eGFR: 132 mL/min/{1.73_m2} (ref >59)

## 2021-12-16 LAB — CBC
Hematocrit: 35 % (ref 34.0–46.6)
Hemoglobin: 11.8 g/dL (ref 11.1–15.9)
MCH: 29.4 pg (ref 26.6–33.0)
MCHC: 33.7 g/dL (ref 31.5–35.7)
MCV: 87 fL (ref 79–97)
Platelets: 82 10*3/uL — CL (ref 150–450)
RBC: 4.01 x10E6/uL (ref 3.77–5.28)
RDW: 14.1 % (ref 11.7–15.4)
WBC: 6.2 10*3/uL (ref 3.4–10.8)

## 2021-12-16 NOTE — Telephone Encounter (Signed)
-----   Message from Reva Bores, MD sent at 12/16/2021  7:22 AM EDT ----- Please call pt, and have her come in for BP check and repeat CBC and CMP today and run them stat

## 2021-12-16 NOTE — Telephone Encounter (Signed)
Called patient and discussed CBC results with her from Monday. Discussed recommended CBC/CMP with BP check today. Patient verbalized understanding and states she can come at 2pm.

## 2021-12-16 NOTE — Progress Notes (Signed)
Patient here today for a BP check. Patient is [redacted]w[redacted]d pregnant with twins. Patient's BP today is 109/58. Patient denies any headache, blurred vision, dizziness, shortness of breath. Per Dr. Shawnie Pons patient to have a CBC and CMP done today. Blood work collected. I reviewed patient's next prenatal appointment date and time with her. Patient verbalized understanding and denies any other questions.   Alesia Richards, RN 12/16/21

## 2021-12-17 ENCOUNTER — Other Ambulatory Visit: Payer: Medicaid Other

## 2021-12-17 ENCOUNTER — Ambulatory Visit: Payer: Medicaid Other | Admitting: *Deleted

## 2021-12-17 ENCOUNTER — Other Ambulatory Visit: Payer: Self-pay | Admitting: *Deleted

## 2021-12-17 ENCOUNTER — Encounter: Payer: Self-pay | Admitting: *Deleted

## 2021-12-17 ENCOUNTER — Ambulatory Visit: Payer: Medicaid Other | Attending: Obstetrics

## 2021-12-17 VITALS — BP 121/63 | HR 103

## 2021-12-17 DIAGNOSIS — K831 Obstruction of bile duct: Secondary | ICD-10-CM

## 2021-12-17 DIAGNOSIS — O24410 Gestational diabetes mellitus in pregnancy, diet controlled: Secondary | ICD-10-CM

## 2021-12-17 DIAGNOSIS — O26643 Intrahepatic cholestasis of pregnancy, third trimester: Secondary | ICD-10-CM

## 2021-12-17 DIAGNOSIS — O99119 Other diseases of the blood and blood-forming organs and certain disorders involving the immune mechanism complicating pregnancy, unspecified trimester: Secondary | ICD-10-CM | POA: Insufficient documentation

## 2021-12-17 DIAGNOSIS — D696 Thrombocytopenia, unspecified: Secondary | ICD-10-CM | POA: Diagnosis not present

## 2021-12-17 DIAGNOSIS — O30043 Twin pregnancy, dichorionic/diamniotic, third trimester: Secondary | ICD-10-CM | POA: Diagnosis present

## 2021-12-17 DIAGNOSIS — O99213 Obesity complicating pregnancy, third trimester: Secondary | ICD-10-CM

## 2021-12-17 DIAGNOSIS — Z3A34 34 weeks gestation of pregnancy: Secondary | ICD-10-CM

## 2021-12-17 DIAGNOSIS — E669 Obesity, unspecified: Secondary | ICD-10-CM

## 2021-12-17 DIAGNOSIS — O0990 Supervision of high risk pregnancy, unspecified, unspecified trimester: Secondary | ICD-10-CM

## 2021-12-17 DIAGNOSIS — Z6832 Body mass index (BMI) 32.0-32.9, adult: Secondary | ICD-10-CM

## 2021-12-18 LAB — PROTEIN / CREATININE RATIO, URINE
Creatinine, Urine: 214.3 mg/dL
Protein, Ur: 47.2 mg/dL
Protein/Creat Ratio: 220 mg/g creat — ABNORMAL HIGH (ref 0–200)

## 2021-12-21 ENCOUNTER — Ambulatory Visit (INDEPENDENT_AMBULATORY_CARE_PROVIDER_SITE_OTHER): Payer: Medicaid Other | Admitting: *Deleted

## 2021-12-21 ENCOUNTER — Other Ambulatory Visit: Payer: Self-pay

## 2021-12-21 VITALS — BP 112/67 | HR 91 | Wt 208.6 lb

## 2021-12-21 DIAGNOSIS — O30043 Twin pregnancy, dichorionic/diamniotic, third trimester: Secondary | ICD-10-CM

## 2021-12-21 DIAGNOSIS — K831 Obstruction of bile duct: Secondary | ICD-10-CM | POA: Diagnosis not present

## 2021-12-21 DIAGNOSIS — O26613 Liver and biliary tract disorders in pregnancy, third trimester: Secondary | ICD-10-CM

## 2021-12-21 DIAGNOSIS — O26643 Intrahepatic cholestasis of pregnancy, third trimester: Secondary | ICD-10-CM

## 2021-12-21 NOTE — Progress Notes (Signed)
Patient seen and assessed by nursing staff.  Agree with documentation and plan.  Twin A NST:  Baseline: 130 bpm, Variability: Good {> 6 bpm), Accelerations: Reactive, and Decelerations: Absent Twin B NST:  Baseline: 135 bpm, Variability: Good {> 6 bpm), Accelerations: Reactive, and Decelerations: Absent

## 2021-12-21 NOTE — Progress Notes (Signed)
NST reactive x2.  Twin A - Vertex, Twin B - transverse, head on maternal Lt

## 2021-12-23 ENCOUNTER — Ambulatory Visit: Payer: Medicaid Other | Attending: Obstetrics

## 2021-12-23 ENCOUNTER — Ambulatory Visit: Payer: Medicaid Other | Admitting: *Deleted

## 2021-12-23 ENCOUNTER — Other Ambulatory Visit: Payer: Self-pay | Admitting: Advanced Practice Midwife

## 2021-12-23 VITALS — BP 116/71 | HR 86

## 2021-12-23 DIAGNOSIS — O99213 Obesity complicating pregnancy, third trimester: Secondary | ICD-10-CM

## 2021-12-23 DIAGNOSIS — O24410 Gestational diabetes mellitus in pregnancy, diet controlled: Secondary | ICD-10-CM | POA: Diagnosis not present

## 2021-12-23 DIAGNOSIS — Z3A35 35 weeks gestation of pregnancy: Secondary | ICD-10-CM

## 2021-12-23 DIAGNOSIS — O99113 Other diseases of the blood and blood-forming organs and certain disorders involving the immune mechanism complicating pregnancy, third trimester: Secondary | ICD-10-CM

## 2021-12-23 DIAGNOSIS — O99119 Other diseases of the blood and blood-forming organs and certain disorders involving the immune mechanism complicating pregnancy, unspecified trimester: Secondary | ICD-10-CM | POA: Diagnosis present

## 2021-12-23 DIAGNOSIS — O30043 Twin pregnancy, dichorionic/diamniotic, third trimester: Secondary | ICD-10-CM

## 2021-12-23 DIAGNOSIS — D696 Thrombocytopenia, unspecified: Secondary | ICD-10-CM

## 2021-12-23 DIAGNOSIS — Z6832 Body mass index (BMI) 32.0-32.9, adult: Secondary | ICD-10-CM | POA: Diagnosis present

## 2021-12-23 DIAGNOSIS — E669 Obesity, unspecified: Secondary | ICD-10-CM

## 2021-12-28 ENCOUNTER — Other Ambulatory Visit: Payer: Self-pay

## 2021-12-28 ENCOUNTER — Ambulatory Visit (INDEPENDENT_AMBULATORY_CARE_PROVIDER_SITE_OTHER): Payer: Medicaid Other | Admitting: Obstetrics & Gynecology

## 2021-12-28 ENCOUNTER — Other Ambulatory Visit: Payer: Medicaid Other

## 2021-12-28 ENCOUNTER — Ambulatory Visit (INDEPENDENT_AMBULATORY_CARE_PROVIDER_SITE_OTHER): Payer: Medicaid Other | Admitting: *Deleted

## 2021-12-28 ENCOUNTER — Other Ambulatory Visit (HOSPITAL_COMMUNITY)
Admission: RE | Admit: 2021-12-28 | Discharge: 2021-12-28 | Disposition: A | Discharge status: Home / Self Care | Payer: Medicaid Other | Source: Ambulatory Visit | Attending: Obstetrics & Gynecology | Admitting: Obstetrics & Gynecology

## 2021-12-28 VITALS — BP 118/71 | HR 81 | Wt 211.0 lb

## 2021-12-28 DIAGNOSIS — Z3480 Encounter for supervision of other normal pregnancy, unspecified trimester: Secondary | ICD-10-CM

## 2021-12-28 DIAGNOSIS — O26613 Liver and biliary tract disorders in pregnancy, third trimester: Secondary | ICD-10-CM

## 2021-12-28 DIAGNOSIS — O24410 Gestational diabetes mellitus in pregnancy, diet controlled: Secondary | ICD-10-CM

## 2021-12-28 DIAGNOSIS — K831 Obstruction of bile duct: Secondary | ICD-10-CM | POA: Diagnosis not present

## 2021-12-28 DIAGNOSIS — D696 Thrombocytopenia, unspecified: Secondary | ICD-10-CM

## 2021-12-28 DIAGNOSIS — O30043 Twin pregnancy, dichorionic/diamniotic, third trimester: Secondary | ICD-10-CM

## 2021-12-28 DIAGNOSIS — Z3A36 36 weeks gestation of pregnancy: Secondary | ICD-10-CM

## 2021-12-28 DIAGNOSIS — Z3483 Encounter for supervision of other normal pregnancy, third trimester: Secondary | ICD-10-CM

## 2021-12-28 NOTE — Progress Notes (Unsigned)
   PRENATAL VISIT NOTE  Subjective:  Megan Shannon is a 26 y.o. G3P2002 at [redacted]w[redacted]d being seen today for ongoing prenatal care.  She is currently monitored for the following issues for this high-risk pregnancy and has Supervision of other normal pregnancy, antepartum; Twin gestation, dichorionic diamniotic; Prediabetes; Gestational diabetes mellitus (GDM) in third trimester; Cholestasis during pregnancy in third trimester; and Thrombocytopenia (HCC) on their problem list.  Patient reports occasional contractions.  Contractions: Not present. Vag. Bleeding: None.  Movement: Present. Denies leaking of fluid.   The following portions of the patient's history were reviewed and updated as appropriate: allergies, current medications, past family history, past medical history, past social history, past surgical history and problem list.   Objective:   Vitals:   12/28/21 1313  BP: 118/71  Pulse: 81  Weight: 211 lb (95.7 kg)    Fetal Status: Fetal Heart Rate (bpm): 147/164   Movement: Present  Presentation: Vertex  General:  Alert, oriented and cooperative. Patient is in no acute distress.  Skin: Skin is warm and dry. No rash noted.   Cardiovascular: Normal heart rate noted  Respiratory: Normal respiratory effort, no problems with respiration noted  Abdomen: Soft, gravid, appropriate for gestational age.  Pain/Pressure: Absent     Pelvic: Cervical exam performed in the presence of a chaperone Dilation: 3 Effacement (%): 50 Station: -3  Extremities: Normal range of motion.     Mental Status: Normal mood and affect. Normal behavior. Normal judgment and thought content.   Assessment and Plan:  Pregnancy: G3P2002 at [redacted]w[redacted]d 1. Supervision of other normal pregnancy, antepartum 36 week labs - GC/Chlamydia probe amp ([place])not at [facility] - Culture, beta strep (group b only) - Comprehensive metabolic panel  2. Thrombocytopenia (HCC) F/u platelets - CBC  3. Dichorionic diamniotic twin  pregnancy in third trimester In testing  4. Diet controlled gestational diabetes mellitus (GDM) in third trimester Good control on diet  Preterm labor symptoms and general obstetric precautions including but not limited to vaginal bleeding, contractions, leaking of fluid and fetal movement were reviewed in detail with the patient. Please refer to After Visit Summary for other counseling recommendations.   No follow-ups on file.  Future Appointments  Date Time Provider Department Center  12/28/2021  2:15 PM Day, Drucilla Schmidt RN [facility]  12/30/2021  9:45 AM [facility] NURSE [facility]  12/30/2021 10:00 AM [facility] US1 [facility]  01/04/2022 12:00 AM [facility] SCHED ROOM [facility] None    Scheryl Darter, MD

## 2021-12-29 LAB — CBC
Hematocrit: 35.7 % (ref 34.0–46.6)
Hemoglobin: 12.3 g/dL (ref 11.1–15.9)
MCH: 29.4 pg (ref 26.6–33.0)
MCHC: 34.5 g/dL (ref 31.5–35.7)
MCV: 85 fL (ref 79–97)
Platelets: 89 10*3/uL — CL (ref 150–450)
RBC: 4.18 x10E6/uL (ref 3.77–5.28)
RDW: 13.1 % (ref 11.7–15.4)
WBC: 6.6 10*3/uL (ref 3.4–10.8)

## 2021-12-29 LAB — COMPREHENSIVE METABOLIC PANEL
ALT: 21 IU/L (ref 0–32)
AST: 21 IU/L (ref 0–40)
Albumin/Globulin Ratio: 1.4 (ref 1.2–2.2)
Albumin: 3.2 g/dL — ABNORMAL LOW (ref 4.0–5.0)
Alkaline Phosphatase: 267 IU/L — ABNORMAL HIGH (ref 44–121)
BUN/Creatinine Ratio: 11 (ref 9–23)
BUN: 6 mg/dL (ref 6–20)
Bilirubin Total: <0.2 mg/dL (ref 0.0–1.2)
CO2: 19 mmol/L — ABNORMAL LOW (ref 20–29)
Calcium: 8.8 mg/dL (ref 8.7–10.2)
Chloride: 104 mmol/L (ref 96–106)
Creatinine, Ser: 0.54 mg/dL — ABNORMAL LOW (ref 0.57–1.00)
Globulin, Total: 2.3 g/dL (ref 1.5–4.5)
Glucose: 68 mg/dL — ABNORMAL LOW (ref 70–99)
Potassium: 4 mmol/L (ref 3.5–5.2)
Sodium: 136 mmol/L (ref 134–144)
Total Protein: 5.5 g/dL — ABNORMAL LOW (ref 6.0–8.5)
eGFR: 129 mL/min/{1.73_m2} (ref >59)

## 2021-12-29 LAB — GC/CHLAMYDIA PROBE AMP (~~LOC~~) NOT AT ARMC
Chlamydia: NEGATIVE
Comment: NEGATIVE
Comment: NORMAL
Neisseria Gonorrhea: NEGATIVE

## 2021-12-30 ENCOUNTER — Ambulatory Visit: Payer: Medicaid Other | Admitting: *Deleted

## 2021-12-30 ENCOUNTER — Ambulatory Visit: Payer: Medicaid Other | Attending: Obstetrics

## 2021-12-30 VITALS — BP 127/68 | HR 100

## 2021-12-30 DIAGNOSIS — O99213 Obesity complicating pregnancy, third trimester: Secondary | ICD-10-CM | POA: Diagnosis not present

## 2021-12-30 DIAGNOSIS — K831 Obstruction of bile duct: Secondary | ICD-10-CM

## 2021-12-30 DIAGNOSIS — O99113 Other diseases of the blood and blood-forming organs and certain disorders involving the immune mechanism complicating pregnancy, third trimester: Secondary | ICD-10-CM | POA: Diagnosis not present

## 2021-12-30 DIAGNOSIS — Z3480 Encounter for supervision of other normal pregnancy, unspecified trimester: Secondary | ICD-10-CM | POA: Insufficient documentation

## 2021-12-30 DIAGNOSIS — O26613 Liver and biliary tract disorders in pregnancy, third trimester: Secondary | ICD-10-CM | POA: Insufficient documentation

## 2021-12-30 DIAGNOSIS — O99119 Other diseases of the blood and blood-forming organs and certain disorders involving the immune mechanism complicating pregnancy, unspecified trimester: Secondary | ICD-10-CM | POA: Insufficient documentation

## 2021-12-30 DIAGNOSIS — Z3A36 36 weeks gestation of pregnancy: Secondary | ICD-10-CM | POA: Diagnosis not present

## 2021-12-30 DIAGNOSIS — D696 Thrombocytopenia, unspecified: Secondary | ICD-10-CM | POA: Insufficient documentation

## 2021-12-30 DIAGNOSIS — O30043 Twin pregnancy, dichorionic/diamniotic, third trimester: Secondary | ICD-10-CM | POA: Insufficient documentation

## 2021-12-30 DIAGNOSIS — O24410 Gestational diabetes mellitus in pregnancy, diet controlled: Secondary | ICD-10-CM | POA: Diagnosis not present

## 2021-12-30 DIAGNOSIS — E669 Obesity, unspecified: Secondary | ICD-10-CM

## 2021-12-30 DIAGNOSIS — Z6832 Body mass index (BMI) 32.0-32.9, adult: Secondary | ICD-10-CM | POA: Insufficient documentation

## 2021-12-30 DIAGNOSIS — O26643 Intrahepatic cholestasis of pregnancy, third trimester: Secondary | ICD-10-CM

## 2021-12-31 ENCOUNTER — Encounter: Payer: Self-pay | Admitting: *Deleted

## 2021-12-31 DIAGNOSIS — B951 Streptococcus, group B, as the cause of diseases classified elsewhere: Secondary | ICD-10-CM | POA: Insufficient documentation

## 2021-12-31 LAB — CULTURE, BETA STREP (GROUP B ONLY): Strep Gp B Culture: POSITIVE — AB

## 2022-01-03 ENCOUNTER — Other Ambulatory Visit (HOSPITAL_COMMUNITY): Payer: Self-pay

## 2022-01-04 ENCOUNTER — Other Ambulatory Visit: Payer: Self-pay

## 2022-01-04 ENCOUNTER — Inpatient Hospital Stay (HOSPITAL_COMMUNITY)
Admission: AD | Admit: 2022-01-04 | Discharge: 2022-01-06 | DRG: 805 | Disposition: A | Discharge status: Home / Self Care | Payer: Medicaid Other | Attending: Family Medicine | Admitting: Family Medicine

## 2022-01-04 ENCOUNTER — Inpatient Hospital Stay (HOSPITAL_COMMUNITY): Payer: Medicaid Other | Admitting: Anesthesiology

## 2022-01-04 ENCOUNTER — Telehealth: Payer: Self-pay | Admitting: Obstetrics and Gynecology

## 2022-01-04 ENCOUNTER — Inpatient Hospital Stay (HOSPITAL_COMMUNITY): Payer: Medicaid Other

## 2022-01-04 ENCOUNTER — Encounter (HOSPITAL_COMMUNITY): Payer: Self-pay | Admitting: Obstetrics & Gynecology

## 2022-01-04 DIAGNOSIS — D6959 Other secondary thrombocytopenia: Secondary | ICD-10-CM | POA: Diagnosis present

## 2022-01-04 DIAGNOSIS — O9912 Other diseases of the blood and blood-forming organs and certain disorders involving the immune mechanism complicating childbirth: Secondary | ICD-10-CM | POA: Diagnosis present

## 2022-01-04 DIAGNOSIS — Z3A37 37 weeks gestation of pregnancy: Secondary | ICD-10-CM

## 2022-01-04 DIAGNOSIS — Z7982 Long term (current) use of aspirin: Secondary | ICD-10-CM | POA: Diagnosis not present

## 2022-01-04 DIAGNOSIS — O24420 Gestational diabetes mellitus in childbirth, diet controlled: Secondary | ICD-10-CM | POA: Diagnosis present

## 2022-01-04 DIAGNOSIS — B951 Streptococcus, group B, as the cause of diseases classified elsewhere: Secondary | ICD-10-CM | POA: Diagnosis present

## 2022-01-04 DIAGNOSIS — D696 Thrombocytopenia, unspecified: Secondary | ICD-10-CM | POA: Diagnosis present

## 2022-01-04 DIAGNOSIS — O99824 Streptococcus B carrier state complicating childbirth: Secondary | ICD-10-CM | POA: Diagnosis present

## 2022-01-04 DIAGNOSIS — Z975 Presence of (intrauterine) contraceptive device: Secondary | ICD-10-CM

## 2022-01-04 DIAGNOSIS — O30043 Twin pregnancy, dichorionic/diamniotic, third trimester: Secondary | ICD-10-CM | POA: Diagnosis present

## 2022-01-04 DIAGNOSIS — O30049 Twin pregnancy, dichorionic/diamniotic, unspecified trimester: Secondary | ICD-10-CM | POA: Diagnosis present

## 2022-01-04 DIAGNOSIS — Z30430 Encounter for insertion of intrauterine contraceptive device: Secondary | ICD-10-CM

## 2022-01-04 DIAGNOSIS — O2662 Liver and biliary tract disorders in childbirth: Secondary | ICD-10-CM | POA: Diagnosis present

## 2022-01-04 DIAGNOSIS — O321XX2 Maternal care for breech presentation, fetus 2: Secondary | ICD-10-CM | POA: Diagnosis present

## 2022-01-04 DIAGNOSIS — Z8719 Personal history of other diseases of the digestive system: Secondary | ICD-10-CM | POA: Diagnosis present

## 2022-01-04 DIAGNOSIS — K831 Obstruction of bile duct: Secondary | ICD-10-CM | POA: Diagnosis present

## 2022-01-04 DIAGNOSIS — O99820 Streptococcus B carrier state complicating pregnancy: Secondary | ICD-10-CM | POA: Diagnosis not present

## 2022-01-04 DIAGNOSIS — Z3480 Encounter for supervision of other normal pregnancy, unspecified trimester: Secondary | ICD-10-CM

## 2022-01-04 DIAGNOSIS — Z8632 Personal history of gestational diabetes: Secondary | ICD-10-CM | POA: Diagnosis present

## 2022-01-04 DIAGNOSIS — O24419 Gestational diabetes mellitus in pregnancy, unspecified control: Secondary | ICD-10-CM | POA: Diagnosis present

## 2022-01-04 DIAGNOSIS — O4202 Full-term premature rupture of membranes, onset of labor within 24 hours of rupture: Secondary | ICD-10-CM | POA: Diagnosis not present

## 2022-01-04 HISTORY — DX: Gestational diabetes mellitus in pregnancy, unspecified control: O24.419

## 2022-01-04 HISTORY — DX: Type 2 diabetes mellitus without complications: E11.9

## 2022-01-04 LAB — CBC WITH DIFFERENTIAL/PLATELET
Abs Immature Granulocytes: 0.03 10*3/uL (ref 0.00–0.07)
Basophils Absolute: 0 10*3/uL (ref 0.0–0.1)
Basophils Relative: 0 %
Eosinophils Absolute: 0 10*3/uL (ref 0.0–0.5)
Eosinophils Relative: 0 %
HCT: 34.8 % — ABNORMAL LOW (ref 36.0–46.0)
Hemoglobin: 11.5 g/dL — ABNORMAL LOW (ref 12.0–15.0)
Immature Granulocytes: 0 %
Lymphocytes Relative: 14 %
Lymphs Abs: 1.3 10*3/uL (ref 0.7–4.0)
MCH: 29.2 pg (ref 26.0–34.0)
MCHC: 33 g/dL (ref 30.0–36.0)
MCV: 88.3 fL (ref 80.0–100.0)
Monocytes Absolute: 0.5 10*3/uL (ref 0.1–1.0)
Monocytes Relative: 5 %
Neutro Abs: 7.2 10*3/uL (ref 1.7–7.7)
Neutrophils Relative %: 81 %
Platelets: 85 10*3/uL — ABNORMAL LOW (ref 150–400)
RBC: 3.94 MIL/uL (ref 3.87–5.11)
RDW: 13.4 % (ref 11.5–15.5)
WBC: 9.1 10*3/uL (ref 4.0–10.5)
nRBC: 0 % (ref 0.0–0.2)

## 2022-01-04 LAB — CBC
HCT: 35.8 % — ABNORMAL LOW (ref 36.0–46.0)
Hemoglobin: 11.9 g/dL — ABNORMAL LOW (ref 12.0–15.0)
MCH: 29.2 pg (ref 26.0–34.0)
MCHC: 33.2 g/dL (ref 30.0–36.0)
MCV: 88 fL (ref 80.0–100.0)
Platelets: 91 10*3/uL — ABNORMAL LOW (ref 150–400)
RBC: 4.07 MIL/uL (ref 3.87–5.11)
RDW: 13.2 % (ref 11.5–15.5)
WBC: 7.1 10*3/uL (ref 4.0–10.5)
nRBC: 0 % (ref 0.0–0.2)

## 2022-01-04 LAB — RPR: RPR Ser Ql: NONREACTIVE

## 2022-01-04 LAB — TYPE AND SCREEN
ABO/RH(D): A POS
Antibody Screen: NEGATIVE

## 2022-01-04 LAB — GLUCOSE, CAPILLARY
Glucose-Capillary: 88 mg/dL (ref 70–99)
Glucose-Capillary: 87 mg/dL (ref 70–99)
Glucose-Capillary: 86 mg/dL (ref 70–99)

## 2022-01-04 MED ORDER — BENZOCAINE-MENTHOL 20-0.5 % EX AERO
1.0000 | INHALATION_SPRAY | CUTANEOUS | Status: DC | PRN
  Administered 2022-01-04: 1 via TOPICAL
  Filled 2022-01-04: qty 56

## 2022-01-04 MED ORDER — LACTATED RINGERS IV SOLN: INTRAVENOUS | Status: DC

## 2022-01-04 MED ORDER — PHENYLEPHRINE 80 MCG/ML (10ML) SYRINGE FOR IV PUSH (FOR BLOOD PRESSURE SUPPORT): 80.0000 ug | PREFILLED_SYRINGE | INTRAVENOUS | Status: DC | PRN

## 2022-01-04 MED ORDER — WITCH HAZEL-GLYCERIN EX PADS: 1.0000 | MEDICATED_PAD | CUTANEOUS | Status: DC | PRN

## 2022-01-04 MED ORDER — EPHEDRINE 5 MG/ML INJ: 10.0000 mg | INTRAVENOUS | Status: DC | PRN

## 2022-01-04 MED ORDER — LACTATED RINGERS IV SOLN: 500.0000 mL | INTRAVENOUS | Status: DC | PRN

## 2022-01-04 MED ORDER — FENTANYL-BUPIVACAINE-NACL 0.5-0.125-0.9 MG/250ML-% EP SOLN
12.0000 mL/h | EPIDURAL | Status: DC | PRN
  Administered 2022-01-04: 12 mL/h via EPIDURAL

## 2022-01-04 MED ORDER — LEVONORGESTREL 20 MCG/DAY IU IUD
1.0000 | INTRAUTERINE_SYSTEM | Freq: Once | INTRAUTERINE | Status: AC
  Administered 2022-01-04: 1 via INTRAUTERINE
  Filled 2022-01-04: qty 1

## 2022-01-04 MED ORDER — OXYTOCIN BOLUS FROM INFUSION
333.0000 mL | Freq: Once | INTRAVENOUS | Status: AC
  Administered 2022-01-04: 333 mL via INTRAVENOUS

## 2022-01-04 MED ORDER — LIDOCAINE HCL (PF) 1 % IJ SOLN
INTRAMUSCULAR | Status: DC | PRN
  Administered 2022-01-04 (×2): 4 mL via EPIDURAL

## 2022-01-04 MED ORDER — DIPHENHYDRAMINE HCL 50 MG/ML IJ SOLN: 12.5000 mg | INTRAMUSCULAR | Status: DC | PRN

## 2022-01-04 MED ORDER — PRENATAL MULTIVITAMIN CH
1.0000 | ORAL_TABLET | Freq: Every day | ORAL | Status: DC
  Administered 2022-01-05 – 2022-01-06 (×2): 1 via ORAL
  Filled 2022-01-04 (×2): qty 1

## 2022-01-04 MED ORDER — ONDANSETRON HCL 4 MG/2ML IJ SOLN: 4.0000 mg | INTRAMUSCULAR | Status: DC | PRN

## 2022-01-04 MED ORDER — OXYCODONE-ACETAMINOPHEN 5-325 MG PO TABS: 1.0000 | ORAL_TABLET | ORAL | Status: DC | PRN

## 2022-01-04 MED ORDER — OXYTOCIN-SODIUM CHLORIDE 30-0.9 UT/500ML-% IV SOLN
2.5000 [IU]/h | INTRAVENOUS | Status: DC
  Administered 2022-01-04: 2.5 [IU]/h via INTRAVENOUS
  Filled 2022-01-04: qty 500

## 2022-01-04 MED ORDER — FENTANYL CITRATE (PF) 100 MCG/2ML IJ SOLN: 100.0000 ug | INTRAMUSCULAR | Status: DC | PRN

## 2022-01-04 MED ORDER — SENNOSIDES-DOCUSATE SODIUM 8.6-50 MG PO TABS
2.0000 | ORAL_TABLET | Freq: Every day | ORAL | Status: DC
Start: 2022-01-05 — End: 2022-01-06
  Administered 2022-01-05 – 2022-01-06 (×2): 2 via ORAL
  Filled 2022-01-04 (×2): qty 2

## 2022-01-04 MED ORDER — SIMETHICONE 80 MG PO CHEW: 80.0000 mg | CHEWABLE_TABLET | ORAL | Status: DC | PRN

## 2022-01-04 MED ORDER — ACETAMINOPHEN 325 MG PO TABS: 650.0000 mg | ORAL_TABLET | ORAL | Status: DC | PRN

## 2022-01-04 MED ORDER — SOD CITRATE-CITRIC ACID 500-334 MG/5ML PO SOLN: 30.0000 mL | ORAL | Status: DC | PRN

## 2022-01-04 MED ORDER — ONDANSETRON HCL 4 MG/2ML IJ SOLN: 4.0000 mg | Freq: Four times a day (QID) | INTRAMUSCULAR | Status: DC | PRN

## 2022-01-04 MED ORDER — IBUPROFEN 600 MG PO TABS
600.0000 mg | ORAL_TABLET | Freq: Four times a day (QID) | ORAL | Status: DC
  Administered 2022-01-04 – 2022-01-06 (×8): 600 mg via ORAL
  Filled 2022-01-04 (×8): qty 1

## 2022-01-04 MED ORDER — PENICILLIN G POT IN DEXTROSE 60000 UNIT/ML IV SOLN
3.0000 10*6.[IU] | INTRAVENOUS | Status: DC
  Administered 2022-01-04 (×4): 3 10*6.[IU] via INTRAVENOUS
  Filled 2022-01-04 (×4): qty 50

## 2022-01-04 MED ORDER — DIPHENHYDRAMINE HCL 25 MG PO CAPS: 25.0000 mg | ORAL_CAPSULE | Freq: Four times a day (QID) | ORAL | Status: DC | PRN

## 2022-01-04 MED ORDER — TERBUTALINE SULFATE 1 MG/ML IJ SOLN: 0.2500 mg | Freq: Once | INTRAMUSCULAR | Status: DC | PRN

## 2022-01-04 MED ORDER — OXYCODONE HCL 5 MG PO TABS: 10.0000 mg | ORAL_TABLET | ORAL | Status: DC | PRN

## 2022-01-04 MED ORDER — LACTATED RINGERS IV SOLN
500.0000 mL | Freq: Once | INTRAVENOUS | Status: AC
  Administered 2022-01-04: 500 mL via INTRAVENOUS

## 2022-01-04 MED ORDER — ONDANSETRON HCL 4 MG PO TABS: 4.0000 mg | ORAL_TABLET | ORAL | Status: DC | PRN

## 2022-01-04 MED ORDER — OXYCODONE HCL 5 MG PO TABS: 5.0000 mg | ORAL_TABLET | ORAL | Status: DC | PRN

## 2022-01-04 MED ORDER — OXYCODONE-ACETAMINOPHEN 5-325 MG PO TABS: 2.0000 | ORAL_TABLET | ORAL | Status: DC | PRN

## 2022-01-04 MED ORDER — SODIUM CHLORIDE 0.9 % IV SOLN
5.0000 10*6.[IU] | Freq: Once | INTRAVENOUS | Status: AC
  Administered 2022-01-04: 5 10*6.[IU] via INTRAVENOUS

## 2022-01-04 MED ORDER — COCONUT OIL OIL: 1.0000 | TOPICAL_OIL | Status: DC | PRN

## 2022-01-04 MED ORDER — DIBUCAINE (PERIANAL) 1 % EX OINT: 1.0000 | TOPICAL_OINTMENT | CUTANEOUS | Status: DC | PRN

## 2022-01-04 MED ORDER — OXYTOCIN-SODIUM CHLORIDE 30-0.9 UT/500ML-% IV SOLN
1.0000 m[IU]/min | INTRAVENOUS | Status: DC
  Administered 2022-01-04: 2 m[IU]/min via INTRAVENOUS
  Filled 2022-01-04: qty 500

## 2022-01-04 MED ORDER — LIDOCAINE HCL (PF) 1 % IJ SOLN: 30.0000 mL | INTRAMUSCULAR | Status: DC | PRN

## 2022-01-04 NOTE — Lactation Note (Signed)
This note was copied from a baby's chart. Lactation Consultation Note Mom holding baby STS. Baby not able to latch well d/t edematous breast tissue. Baby in cradle position. LC changed baby to football hold and was able to slightly latch.  LC doesn't think baby was able to transfer anything d/t unable to obtain a deep latch. After being weighed baby boy was given to FOB to hold STS and was cover w/several blankets. Since mom was GDM discussed w/mom pumping and supplementing. Mom in agreement for once on MBU.  Patient Name: Megan Shannon [id number] Date: 01/04/2022 Reason for consult: L&D Initial assessment;Multiple gestation;Early term 37-38.6wks;Maternal endocrine disorder Age:23 hours  Maternal Data Does the patient have breastfeeding experience prior to this delivery?: Yes How long did the patient breastfeed?: 26 yr old for 4 months and 26 yr old for 3 months  Feeding    LATCH Score Latch: Repeated attempts needed to sustain latch, nipple held in mouth throughout feeding, stimulation needed to elicit sucking reflex.  Audible Swallowing: None  Type of Nipple: Everted at rest and after stimulation (very short shaft/edema)  Comfort (Breast/Nipple): Filling, red/small blisters or bruises, mild/mod discomfort (edema)  Hold (Positioning): Full assist, staff holds infant at breast  LATCH Score: 4   Lactation Tools Discussed/Used    Interventions Interventions: Adjust position;Assisted with latch;Support pillows;Skin to skin;Position options;Breast massage;Reverse pressure;Breast compression  Discharge    Consult Status Consult Status: Follow-up from L&D Date: 01/04/22 Follow-up type: In-patient    Charyl Dancer 01/04/2022, 8:38 PM

## 2022-01-04 NOTE — Discharge Summary (Signed)
Postpartum Discharge Summary  Date of Service updated***     Patient Name: Megan Shannon DOB: 27-Feb-1996 MRN: [id number]  Date of admission: 01/04/2022 Delivery date:   Ariell, Gunnels [[id number]]  01/04/2022    Criss, Bartles [[id number]]  01/04/2022 Delivering provider:    Landyn, Lorincz [[id number]]  Celeste, Tavenner [[id number]]  Christin Fudge Date of discharge: 01/04/2022  Admitting diagnosis: Indication for care in labor or delivery [O75.9] Intrauterine pregnancy: [redacted]w[redacted]d    Secondary diagnosis:  Principal Problem:   Indication for care in labor or delivery Active Problems:   IUD (intrauterine device) in place  Additional problems: Di/Di twins; A1DM; cholestasis,  thrombocytopenia of pregnancy    Discharge diagnosis: Term Pregnancy Delivered, GDM A1, and ICP                                               Post partum procedures: Mirena IUD Augmentation: AROM and Pitocin Complications: None  Hospital course: Induction of Labor With Vaginal Delivery   26y.o. yo G3P2002 at 338w0das admitted to the hospital 01/04/2022 for induction of labor.  Indication for induction: A1 DM and Cholestasis of pregnancy.  Patient had an uncomplicated labor course as follows: Membrane Rupture Time/Date:    GaJillayne, Witte0[[id number]]3:35 PM    GaSabrina, Keough0[[id number]]3:35 PM ,   GaTifini, Reeder0[[id number]]01/04/2022    GaNykole, Matos0[[id number]]01/04/2022   Delivery Method:   GaEfrain Sella0[[id number]]Vaginal, Spontaneous    GaHarriett Rushisbeth [[id number]]Vaginal, Breech  Episiotomy:    GaLorella, Gomez0[[id number]]None    GaHarriett RushiWaubeka0[[id number]]None  Lacerations:     GaHoward, Patton0[[id number]]None     GaAthena, Baltz0[[id number]]None  Details of delivery can be found in separate delivery note.  Patient had a routine postpartum course. Patient is discharged home 01/04/22.  Newborn Data: Birth date:   GaNovaleigh, Kohlman0[[id number]]01/04/2022    GaAirlie, Blumenberg0[[id number]]01/04/2022  Birth time:   GaMishaal, Lansdale0[[id number]]6:48 PM    GaHarriett RushiToledo0[[id number]]6:59 PM  Gender:   GaAhnika, Hannibal0[[id number]]Female    GaAdalyn, Pennock0[[id number]]Female  Living status:   GaYavonne, Kiss0[[id number]]Living    GaHarriett RushiBronx0[[id number]]Living  Apgars:   GaLailie, Smead0[[id number]]9 [address]0[[id number]]8 Bufford Spikes GaCarisma, Troupe0[[id number]]9    GaHarriett RushiBangor0[[id number]]9  Weight:   GaSahana, Boyland0[[id number]]2800 g    GaJoselynn, Amoroso0[[id number]]   Magnesium Sulfate received: No BMZ received: No Rhophylac:N/A MMR:N/A T-DaP:Given prenatally Flu: Yes Transfusion:{Transfusion received:30440034}  Physical exam  Vitals:   01/04/22 1731 01/04/22 1800 01/04/22 1902 01/04/22 1916  BP: 124/82 128/87 123/74 109/76  Pulse: 87 90 85 75  Resp: 18 20 16 18   Temp:  98.8 F (37.1 C)    TempSrc:  Oral    SpO2:      Weight:      Height:       General: {Exam; general:21111117} Lochia: {Desc; appropriate/inappropriate:30686::"appropriate"}  Uterine Fundus: {Desc; firm/soft:30687} Incision: {Exam; incision:21111123} DVT Evaluation: {Exam; SUG:6484720} Labs: Lab Results  Component Value Date   WBC 7.1 01/04/2022   HGB 11.9 (L) 01/04/2022   HCT 35.8 (L) 01/04/2022   MCV 88.0 01/04/2022   PLT 91 (L) 01/04/2022      Latest Ref Rng & Units 12/28/2021    1:49 PM  CMP  Glucose 70 - 99 mg/dL 68   BUN 6 - 20 mg/dL 6    Creatinine 0.57 - 1.00 mg/dL 0.54   Sodium 134 - 144 mmol/L 136   Potassium 3.5 - 5.2 mmol/L 4.0   Chloride 96 - 106 mmol/L 104   CO2 20 - 29 mmol/L 19   Calcium 8.7 - 10.2 mg/dL 8.8   Total Protein 6.0 - 8.5 g/dL 5.5   Total Bilirubin 0.0 - 1.2 mg/dL <0.2   Alkaline Phos 44 - 121 IU/L 267   AST 0 - 40 IU/L 21   ALT 0 - 32 IU/L 21    Edinburgh Score:    12/15/2018    5:46 AM  Edinburgh Postnatal Depression Scale Screening Tool  I have been able to laugh and see the funny side of things. 0  I have looked forward with enjoyment to things. 0  I have blamed myself unnecessarily when things went wrong. 0  I have been anxious or worried for no good reason. 0  I have felt scared or panicky for no good reason. 0  Things have been getting on top of me. 0  I have been so unhappy that I have had difficulty sleeping. 0  I have felt sad or miserable. 0  I have been so unhappy that I have been crying. 0  The thought of harming myself has occurred to me. 0  Edinburgh Postnatal Depression Scale Total 0     After visit meds:  Allergies as of 01/04/2022   No Known Allergies   Med Rec must be completed prior to using this [facility]***        Discharge home in stable condition Infant Feeding: Bottle Infant Disposition:{CHL IP OB HOME WITH TKTCCE:83374} Discharge instruction: per After Visit Summary and Postpartum booklet. Activity: Advance as tolerated. Pelvic rest for 6 weeks.  Diet: routine diet Future Appointments:No future appointments. Follow up Visit:   Please schedule this patient for a In person postpartum visit in 4 weeks with the following provider: Any provider. Additional Postpartum F/U:2 hour GTT and IUD string check   High risk pregnancy complicated by: GDM and ICP Delivery mode:     Chevella, Pearce [[id number]]  Vaginal, Spontaneous    Lauriann, Milillo [[id number]]  Vaginal, Breech Anticipated Birth Control:  PP IUD  placed   01/04/2022 Christin Fudge, CNM

## 2022-01-04 NOTE — Progress Notes (Signed)
LABOR PROGRESS NOTE  Megan Shannon is a 26 y.o. G3P2002 at [redacted]w[redacted]d  admitted for IOL fot cholestasis in pregnancy.  Subjective: Currntly doing well, comfortable on with epidural   Objective: BP 124/82   Pulse 87   Temp 99 F (37.2 C) (Oral)   Resp 18   Ht 5\' 4"  (1.626 m)   Wt 98.2 kg   LMP 04/20/2021   SpO2 99%   BMI 37.16 kg/m  or  Vitals:   01/04/22 1601 01/04/22 1631 01/04/22 1701 01/04/22 1731  BP: 122/76 120/74 124/74 124/82  Pulse: 94 87 82 87  Resp: 16 20 18 18   Temp: 99 F (37.2 C)     TempSrc: Oral     SpO2:      Weight:      Height:       Dilation: 6.5 Effacement (%): 70, 80 Station: -2 Presentation: Vertex Exam by:: Dr Dishhmon FHT: baseline rate 140, moderate varibility, positive acel, no decel Toco: every 3-5 min   Labs: Lab Results  Component Value Date   WBC 7.1 01/04/2022   HGB 11.9 (L) 01/04/2022   HCT 35.8 (L) 01/04/2022   MCV 88.0 01/04/2022   PLT 91 (L) 01/04/2022    Patient Active Problem List   Diagnosis Date Noted   Indication for care in labor or delivery 01/04/2022   Group B streptococcal infection in pregnancy 12/31/2021   Thrombocytopenia (HCC) 12/14/2021   Cholestasis during pregnancy in third trimester 12/11/2021   Gestational diabetes mellitus (GDM) in third trimester 11/12/2021   Prediabetes 08/03/2021   Twin gestation, dichorionic diamniotic 07/06/2021   Supervision of other normal pregnancy, antepartum 06/23/2021    Assessment / Plan: 26 y.o. G3P2002 at [redacted]w[redacted]d here for IOL for cholestasis in pregnancy   Labor: Progressing well, AROM at this check with clear fluid. Will titrate as able.  Fetal Wellbeing:  Cat 1 Pain Control:  epidural in place Anticipated MOD:  vaginal if possible,  34, MD  OB Fellow  01/04/2022, 5:48 PM

## 2022-01-04 NOTE — Anesthesia Procedure Notes (Signed)
Epidural Patient location during procedure: OB Start time: 01/04/2022 2:58 AM End time: 01/04/2022 3:01 AM  Staffing Anesthesiologist: Kaylyn Layer, MD Performed: anesthesiologist   Preanesthetic Checklist Completed: patient identified, IV checked, risks and benefits discussed, monitors and equipment checked, pre-op evaluation and timeout performed  Epidural Patient position: sitting Prep: DuraPrep and site prepped and draped Patient monitoring: continuous pulse ox, blood pressure and heart rate Approach: midline Location: L3-L4 Injection technique: LOR air  Needle:  Needle type: Tuohy  Needle gauge: 17 G Needle length: 9 cm Needle insertion depth: 6 cm Catheter type: closed end flexible Catheter size: 19 Gauge Catheter at skin depth: 11 cm Test dose: negative and Other (1% lidocaine)  Assessment Events: blood not aspirated, injection not painful, no injection resistance, no paresthesia and negative IV test  Additional Notes Patient identified. Risks, benefits, and alternatives discussed with patient including but not limited to bleeding, infection, nerve damage, paralysis, failed block, incomplete pain control, headache, blood pressure changes, nausea, vomiting, reactions to medication, itching, and postpartum back pain. Confirmed with bedside nurse the patient's most recent platelet count. Confirmed with patient that they are not currently taking any anticoagulation, have any bleeding history, or any family history of bleeding disorders. Patient expressed understanding and wished to proceed. All questions were answered. Sterile technique was used throughout the entire procedure. Please see nursing notes for vital signs.   Crisp LOR on first pass. Test dose was given through epidural catheter and negative prior to continuing to dose epidural or start infusion. Warning signs of high block given to the patient including shortness of breath, tingling/numbness in hands, complete  motor block, or any concerning symptoms with instructions to call for help. Patient was given instructions on fall risk and not to get out of bed. All questions and concerns addressed with instructions to call with any issues or inadequate analgesia.  Reason for block:procedure for pain

## 2022-01-04 NOTE — Anesthesia Preprocedure Evaluation (Addendum)
Anesthesia Evaluation  Patient identified by MRN, date of birth, ID band Patient awake    Reviewed: Allergy & Precautions, Patient's Chart, lab work & pertinent test results  History of Anesthesia Complications Negative for: history of anesthetic complications  Airway Mallampati: II  TM Distance: >3 FB Neck ROM: Full    Dental no notable dental hx.    Pulmonary neg pulmonary ROS,    Pulmonary exam normal        Cardiovascular negative cardio ROS Normal cardiovascular exam     Neuro/Psych negative neurological ROS  negative psych ROS   GI/Hepatic negative GI ROS, Neg liver ROS,   Endo/Other  diabetes, Gestational  Renal/GU negative Renal ROS  negative genitourinary   Musculoskeletal negative musculoskeletal ROS (+)   Abdominal   Peds  Hematology Gestational thrombocytopenia (plts 91k)   Anesthesia Other Findings Day of surgery medications reviewed with patient.  Reproductive/Obstetrics (+) Pregnancy (twin gestation)                            Anesthesia Physical Anesthesia Plan  ASA: 2  Anesthesia Plan: Epidural   Post-op Pain Management:    Induction:   PONV Risk Score and Plan: Treatment may vary due to age or medical condition  Airway Management Planned: Natural Airway  Additional Equipment: Fetal Monitoring  Intra-op Plan:   Post-operative Plan:   Informed Consent: I have reviewed the patients History and Physical, chart, labs and discussed the procedure including the risks, benefits and alternatives for the proposed anesthesia with the patient or authorized representative who has indicated his/her understanding and acceptance.       Plan Discussed with:   Anesthesia Plan Comments:         Anesthesia Quick Evaluation

## 2022-01-04 NOTE — Progress Notes (Signed)
Turned pt to right side.

## 2022-01-04 NOTE — Progress Notes (Signed)
Witnessed the wasting of the epidural 41.6 in the WIS with the nurse on duty Lamont Snowball

## 2022-01-04 NOTE — Lactation Note (Signed)
This note was copied from a baby's chart. Lactation Consultation Note  Patient Name: Megan Shannon [id number] Date: 01/04/2022 Reason for consult: Initial assessment;Multiple gestation;Early term 37-38.6wks;Maternal endocrine disorder Age:28 hours  RN has gotten babies latched. RN also set up DEBP.  [facility] taught mom how to hand express. Noted thick colostrum. Collected a few drops. Rubbed on both babies gums w/gloved finger. LC discussed w/mom wearing shells to soften breast tissue for easier latching. Mom stated that she had to wear NS at the beginning after both of her other 2 children. Mom has never worn shells before. Suggested mom wear them in am to see if mom will be able to obtain deeper latch w/o having to use NS. If she does then mom is OK w/that as well. Mom isn't feeling well, having pain and light headedness. Since hand expressed mom doesn't want to pump at this time. Discussed pumping and supplementing. Mom will call for latch assistance.  Maternal Data Has patient been taught Hand Expression?: Yes Does the patient have breastfeeding experience prior to this delivery?: Yes How long did the patient breastfeed?: 4 months, 3 months  Feeding Nipple Type: Slow - flow  LATCH Score Latch: Repeated attempts needed to sustain latch, nipple held in mouth throughout feeding, stimulation needed to elicit sucking reflex.  Audible Swallowing: None  Type of Nipple: Everted at rest and after stimulation (very short shaft/edema)  Comfort (Breast/Nipple): Filling, red/small blisters or bruises, mild/mod discomfort (edema)  Hold (Positioning): Full assist, staff holds infant at breast  LATCH Score: 4   Lactation Tools Discussed/Used    Interventions Interventions: Breast massage;Shells;LC Services brochure  Discharge    Consult Status Consult Status: Follow-up Date: 01/05/22 Follow-up type: In-patient    Charyl Dancer 01/04/2022, 11:41 PM

## 2022-01-04 NOTE — Lactation Note (Addendum)
This note was copied from a baby's chart. Lactation Consultation Note  Patient Name: Megan Shannon [id number] Date: 01/04/2022 Reason for consult: Initial assessment;Early term 37-38.6wks;Maternal endocrine disorder;Multiple gestation Age:28 hours  RN has gotten babies latched. RN also set up DEBP.  [facility] taught mom how to hand express. Noted thick colostrum. Collected a few drops. Rubbed on both babies gums w/gloved finger. LC discussed w/mom wearing shells to soften breast tissue for easier latching. Mom stated that she had to wear NS at the beginning after both of her other 2 children. Mom has never worn shells before. Suggested mom wear them in am to see if mom will be able to obtain deeper latch w/o having to use NS. If she does then mom is OK w/that as well. Mom isn't feeling well, having pain and light headedness. Since hand expressed mom doesn't want to pump at this time. Discussed pumping and supplementing. Mom will call for latch assistance. Maternal Data Has patient been taught Hand Expression?: Yes Does the patient have breastfeeding experience prior to this delivery?: Yes How long did the patient breastfeed?: 4 months, 3 months  Feeding    LATCH Score Latch: Repeated attempts needed to sustain latch, nipple held in mouth throughout feeding, stimulation needed to elicit sucking reflex.  Audible Swallowing: None  Type of Nipple: Everted at rest and after stimulation (very short shaft/edema)  Comfort (Breast/Nipple): Filling, red/small blisters or bruises, mild/mod discomfort (edmea)  Hold (Positioning): Assistance needed to correctly position infant at breast and maintain latch.  LATCH Score: 5   Lactation Tools Discussed/Used Tools: Shells Breast pump type: Double-Electric Breast Pump Pump Education: Setup, frequency, and cleaning;Milk Storage Reason for Pumping: twins, stimulation Pumping frequency: encouraged every 3  hours  Interventions Interventions: Adjust position;Assisted with latch;Support pillows;Skin to skin;Position options;Breast compression  Discharge    Consult Status Consult Status: Follow-up Date: 01/05/22 Follow-up type: In-patient    Charyl Dancer 01/04/2022, 11:32 PM

## 2022-01-04 NOTE — Progress Notes (Signed)
Patient's epidural medication not loaded in Pyxis to waste. Per pharmacy recommendation, RN wasted medication in stericycle with another RN Oneita Hurt, RN) witnessing. 41.77mL wasted.

## 2022-01-04 NOTE — Progress Notes (Signed)
Bedside u/s used to place cardios with Dr Miquel Dunn

## 2022-01-04 NOTE — H&P (Signed)
OBSTETRIC ADMISSION HISTORY AND PHYSICAL  Megan Shannon is a 26 y.o. female G3P2002 with IUP at 48w0dby LMP presenting for IOL in the setting of cholestasis. She reports some contractions and pelvic pressure. No leaking fluid, vaginal bleeding, no blurry vision, headaches, peripheral edema, or RUQ pain. Endorses active fetal movement. She plans on breast and bottle feeding. She undecided on birth control. [facility] Dating: By LMP --->  Estimated Date of Delivery: 01/25/22  Sono:    @[redacted]w[redacted]d , CWD, normal anatomy, di/di twin pregnancy, cephalic/variable presentation, anterior placenta, 272/294g, 49/73% EFW, 7% discordance  @[redacted]w[redacted]d , CWD, normal anatomy, cephalic/cephalic presentation, anterior placenta, 2673g/3146g, 29/77%, 15% discordance  Prenatal History/Complications:  - Di/di twins - A1GDM - Gestational thrombocytopenia - GBS positive  Past Medical History: Past Medical History:  Diagnosis Date   Atypical chest pain    Diabetes mellitus without complication ([place]    Gestational diabetes    Seasonal allergies     Past Surgical History: Past Surgical History:  Procedure Laterality Date   WISDOM TOOTH EXTRACTION      Obstetrical History: OB History     Gravida  3   Para  2   Term  2   Preterm      AB      Living  2      SAB      IAB      Ectopic      Multiple  0   Live Births  2           Social History Social History   Socioeconomic History   Marital status: Single    Spouse name: Not on file   Number of children: Not on file   Years of education: Not on file   Highest education level: Not on file  Occupational History   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  Tobacco Use   Smoking status: Never   Smokeless tobacco: Never  Vaping Use   Vaping Use: Never used  Substance and Sexual Activity   Alcohol use: Not Currently    Comment: not while preg   Drug use: Never   Sexual activity: Yes     Birth control/protection: None  Other Topics Concern   Not on file  Social History Narrative   Not on file   Social Determinants of Health   Financial Resource Strain: Not on file  Food Insecurity: No Food Insecurity (11/02/2021)   Hunger Vital Sign    Worried About Running Out of Food in the Last Year: Never true    Ran Out of Food in the Last Year: Never true  Transportation Needs: No Transportation Needs (11/02/2021)   PRAPARE - THydrologist(Medical): No    Lack of Transportation (Non-Medical): No  Physical Activity: Not on file  Stress: Stress Concern Present (12/01/2018)   [place]   Feeling of Stress : To some extent  Social Connections: Not on file    Family History: Family History  Problem Relation Age of Onset   Healthy Mother    Thyroid nodules Brother    Diabetes Maternal Grandmother    Allergies: No Known Allergies  Medications Prior to Admission  Medication Sig Dispense Refill Last Dose   aspirin EC 81 MG tablet Take 1 tablet (81 mg total) by mouth daily. Swallow whole. 90 tablet 1 01/03/2022   Doxylamine-Pyridoxine (DICLEGIS) 10-10 MG  TBEC Take 2 tabs at bedtime. If needed, add another tab in the morning. If needed, add another tab in the afternoon, up to 4 tabs/day. 100 tablet 1 01/03/2022   hydrOXYzine (ATARAX) 25 MG tablet Take 1 tablet (25 mg total) by mouth 3 (three) times daily as needed. 90 tablet 1 01/03/2022   omeprazole (PRILOSEC) 40 MG capsule Take 1 capsule (40 mg total) by mouth daily. 30 capsule 2 Past Week   prenatal vitamin w/FE, FA (PRENATAL 1 + 1) 27-1 MG TABS tablet Take 1 tablet by mouth daily at 12 noon. 30 tablet 11 01/03/2022   ursodiol (ACTIGALL) 500 MG tablet Take 1 tablet (500 mg total) by mouth 2 (two) times daily. 60 tablet 1 01/03/2022   Accu-Chek Softclix Lancets lancets Use four times daily as instructed. 100 each 12    Blood Glucose  Monitoring Suppl (ACCU-CHEK GUIDE) w/Device KIT 1 Device by Does not apply route in the morning, at noon, in the evening, and at bedtime. 1 kit 0    Blood Pressure Monitoring DEVI 1 each by Does not apply route once a week. 1 each 0    glucose blood test strip Use as instructed 100 each 12    Review of Systems   All systems reviewed and negative except as stated in HPI  Blood pressure 123/77, pulse 77, temperature 98.2 F (36.8 C), temperature source Oral, resp. rate 17, height 5' 4"  (1.626 m), weight 98.2 kg, last menstrual period 04/20/2021. General appearance: alert, cooperative, and no distress Lungs: effort normal Heart: regular rate Abdomen: soft, non-tender; gravid Pelvis: adequate Extremities: no sign of DVT Presentation:  cephalic/breech by BSUS  performed by Dr. Nelda Marseille Fetal monitoring: Baby A: 135 bpm, moderate variability, +15x15 accels, no decels; Baby B: 130bpm, moderate variability, +15x15 accels, no decels Uterine activity: Q 3-7mns with ui Dilation: 5.5 Effacement (%): 70 Station: -1 Exam by:: DAndee Poles CNM  Prenatal labs: ABO, Rh: --/--/A POS (07/24 0103) Antibody: NEG (07/24 0103) Rubella: 1.30 (01/23 1613) RPR: Non Reactive (05/22 0800)  HBsAg: Negative (01/23 1613)  HIV: Non Reactive (05/22 0800)  GBS: Positive/-- (07/17 1351)  2 hr Glucola: 85/194/116 Genetic screening: LR NIPS, AFP neg Anatomy [place] normal  Prenatal Transfer Tool  Maternal Diabetes: Yes:  Diabetes Type:  Diet controlled Genetic Screening: Normal Maternal Ultrasounds/Referrals: Normal Fetal Ultrasounds or other Referrals:  None Maternal Substance Abuse:  No Significant Maternal Medications:  None Significant Maternal Lab Results: Group B Strep positive  Results for orders placed or performed during the hospital encounter of 01/04/22 (from the past 24 hour(s))  CBC   Collection Time: 01/04/22  1:03 AM  Result Value Ref Range   WBC 7.1 4.0 - 10.5 K/uL   RBC 4.07 3.87 - 5.11 MIL/uL    Hemoglobin 11.9 (L) 12.0 - 15.0 g/dL   HCT 35.8 (L) 36.0 - 46.0 %   MCV 88.0 80.0 - 100.0 fL   MCH 29.2 26.0 - 34.0 pg   MCHC 33.2 30.0 - 36.0 g/dL   RDW 13.2 11.5 - 15.5 %   Platelets 91 (L) 150 - 400 K/uL   nRBC 0.0 0.0 - 0.2 %  Type and screen [place]  Collection Time: 01/04/22  1:03 AM  Result Value Ref Range   ABO/RH(D) A POS    Antibody Screen NEG    Sample Expiration      01/07/2022,2359 Performed at [facility] [address], [place] [address]  Patient Active Problem List   Diagnosis Date Noted   Indication for care in labor or delivery 01/04/2022   Group B streptococcal infection in pregnancy 12/31/2021   Thrombocytopenia ([place]) 12/14/2021   Cholestasis during pregnancy in third trimester 12/11/2021   Gestational diabetes mellitus (GDM) in third trimester 11/12/2021   Prediabetes 08/03/2021   Twin gestation, dichorionic diamniotic 07/06/2021   Supervision of other normal pregnancy, antepartum 06/23/2021    Assessment/Plan:  Leyli Kevorkian is a 26 y.o. G3P2002 at 37w0dhere for IOL in the setting of cholestasis.   #Labor: Patient was counseled by Dr. ONelda Marseilleon risks/benefits of vaginal delivery vs cesarean delivery of twins. Patient desires to proceed with vaginal attempt. Twin A cephalic. Twin B breech. Patient contracting and 5.5cm. Expectant management for now. May augment with low dose Pitocin prn. AROM when appropriate.  #A1GDM: Q4H CBG's #Pain: Desires epidural. Anesthesia to consult with patient about epidural placement given platelets 91 #FWB: Cat 1 #ID: GBS pos > PCN #MOF: Breast/bottle #MOC: Undecided #Circ: yes   DRenee Harder CNM  01/04/2022, 2:21 AM

## 2022-01-04 NOTE — Progress Notes (Signed)
Labor Progress Note Megan Shannon is a 26 y.o. G3P2002 at [redacted]w[redacted]d presented for IOL for ICP, GDM, didi twins  S:  Comfortable with epidural, no complaints.   O:  BP 114/72   Pulse 81   Temp 97.7 F (36.5 C) (Oral)   Resp 17   Ht 5\' 4"  (1.626 m)   Wt 98.2 kg   LMP 04/20/2021   SpO2 99%   BMI 37.16 kg/m  EFM: baseline 140 bpm/ mod variability/ + accels/ variable decels  EFM: baseline 150 bpm/ mod variability/ + accels/ no decels  Toco/IUPC: 2-5 SVE: deferred Pitocin: 12 mu/min CBG: 88  A/P: 26 y.o. G3P2002 [redacted]w[redacted]d  1. Labor: latent 2. FWB: Cat II(A)/I(B) 3. Pain: epidural 4. GDM: stable 5. ITP: stable  Continue Pitocin, consider AROM at next check. Anticipate labor progress and SVD.  [redacted]w[redacted]d, CNM 9:51 AM

## 2022-01-04 NOTE — Progress Notes (Signed)
OB Note Met with patient and partner. Patient in labor at 12/0 with di-di twins with IOL for cholestasis. Pt currently 4-5 and plan is for AROM. Twins appropriately sized with 15% discordance with baby A smaller at 2673gm, 29% and B 3146gm, 77%. Patient with prior 10lbs SVD  B is currently breech. I d/w her re: risk of head entrapment with delivery with B if B does not turn to cephalic after A's birth. Pt okay with risk and desires to proceed with IOL  Durene Romans MD Attending Center for [place] (Faculty Practice) 01/04/2022 Time: [phone]

## 2022-01-04 NOTE — Progress Notes (Signed)
Platelets 85, per Dr. Hyacinth Meeker, it is okay for patient to receive ibuprofen as scheduled.

## 2022-01-04 NOTE — Lactation Note (Signed)
This note was copied from a baby's chart. Lactation Consultation Note Baby girl more interested in BF than baby boy. In football hold baby girl latched off and on d/t short shaft/edematous breast tissue, difficulty obtaining deep latch. LC worried at this time babies aren't able to obtain deep latch for transfer. Encouraged STS keep babies covered on outside of STS. Mom denies painful latches. LC will f/u w/mom and twins tonight and set up DEBP for mom and assist in latching and supplementing. Leaving for bonding time and face timing w/other 2 boys at home.  Patient Name: Megan Shannon GGYIR'S Date: 01/04/2022 Reason for consult: L&D Initial assessment;Multiple gestation;Early term 37-38.6wks;Maternal endocrine disorder Age:26 hours  Maternal Data Does the patient have breastfeeding experience prior to this delivery?: Yes How long did the patient breastfeed?: 27 yr old for 24 months, 26 yr old for 3 months  Feeding    LATCH Score Latch: Repeated attempts needed to sustain latch, nipple held in mouth throughout feeding, stimulation needed to elicit sucking reflex.  Audible Swallowing: None  Type of Nipple: Everted at rest and after stimulation (very short shaft/edema)  Comfort (Breast/Nipple): Filling, red/small blisters or bruises, mild/mod discomfort (edmea)  Hold (Positioning): Assistance needed to correctly position infant at breast and maintain latch.  LATCH Score: 5   Lactation Tools Discussed/Used    Interventions Interventions: Adjust position;Assisted with latch;Support pillows;Skin to skin;Position options;Breast compression  Discharge    Consult Status Consult Status: Follow-up from L&D Date: 01/04/22 Follow-up type: In-patient    Charyl Dancer 01/04/2022, 8:47 PM

## 2022-01-04 NOTE — Progress Notes (Signed)
I was unable to trace both baby A and baby B while pt lying on right side.  Pt repositioned to left tilt.  I was finally able to trace both babies in this position

## 2022-01-05 ENCOUNTER — Encounter (HOSPITAL_COMMUNITY): Payer: Self-pay | Admitting: Obstetrics & Gynecology

## 2022-01-05 LAB — GLUCOSE, CAPILLARY: Glucose-Capillary: 84 mg/dL (ref 70–99)

## 2022-01-05 NOTE — Progress Notes (Cosign Needed)
POSTPARTUM PROGRESS NOTE  Post Partum Day #1  Subjective:  Megan Shannon is a 25 y.o. G3P3004 s/p SVD at [redacted]w[redacted]d.  No acute events overnight.  Pt denies problems with ambulating, voiding or po intake.  She denies nausea or vomiting.  Pain is well controlled on ibuprofen.  She has had flatus. She has not had bowel movement.  Lochia moderate. Continues to have some numbness along her RLE.    Objective: Blood pressure 98/76, pulse 66, temperature 98.3 F (36.8 C), temperature source Oral, resp. rate 18, height 5' 4" (1.626 m), weight 98.2 kg, last menstrual period 04/20/2021, SpO2 99 %, unknown if currently breastfeeding.  Physical Exam:  General: alert, cooperative and no distress Chest: no respiratory distress Heart:regular rate, distal pulses intact Abdomen: soft, nontender,  Uterine Fundus: firm, appropriately tender DVT Evaluation: No calf swelling or tenderness Extremities: No peripheral edema Skin: warm, dry  Recent Labs    01/04/22 0103 01/04/22 2002  HGB 11.9* 11.5*  HCT 35.8* 34.8*    Assessment/Plan: Megan Shannon is a 25 y.o. G3P3004 s/p SVD at [redacted]w[redacted]d   PPD#1 - Doing well, continue routine postpartum care Contraception: Post-placental IUD placed Feeding: Breast/bottle A1GDM: Diet-controlled, FBG appropriate Dispo: Plan for discharge PPD#2.   LOS: 1 day   Williams, Ashley S, MD 01/05/2022, 8:11 AM   I personally saw and evaluated the patient, performing the key elements of the service. I developed and verified the management plan that is described in the resident's/student's note, and I agree with the content with my edits above. VSS, HRR&R, Resp unlabored, Legs neg.  Fran Cresenzo-Dishmon, CNM 01/07/2022 9:45 AM    

## 2022-01-05 NOTE — Anesthesia Postprocedure Evaluation (Signed)
Anesthesia Post Note  Patient: Megan Shannon  Procedure(s) Performed: AN AD HOC LABOR EPIDURAL     Patient location during evaluation: Mother Baby Anesthesia Type: Epidural Level of consciousness: awake Pain management: satisfactory to patient Vital Signs Assessment: post-procedure vital signs reviewed and stable Respiratory status: spontaneous breathing Cardiovascular status: stable Anesthetic complications: no   No notable events documented.  Last Vitals:  Vitals:   01/05/22 0230 01/05/22 0608  BP: 109/75 98/76  Pulse: 76 66  Resp: 18 18  Temp: 36.7 C 36.8 C  SpO2:  99%    Last Pain:  Vitals:   01/05/22 0608  TempSrc: Oral  PainSc: 2    Pain Goal:                   Cephus Shelling

## 2022-01-06 LAB — SURGICAL PATHOLOGY

## 2022-01-06 MED ORDER — IBUPROFEN 600 MG PO TABS: 600.0000 mg | ORAL_TABLET | Freq: Four times a day (QID) | ORAL | 0 refills | Status: AC | PRN

## 2022-01-06 MED ORDER — ACETAMINOPHEN 500 MG PO TABS: 1000.0000 mg | ORAL_TABLET | Freq: Three times a day (TID) | ORAL | 0 refills | Status: AC | PRN

## 2022-01-11 ENCOUNTER — Encounter: Payer: Self-pay | Admitting: Obstetrics & Gynecology

## 2022-01-13 ENCOUNTER — Telehealth (HOSPITAL_COMMUNITY): Payer: Self-pay | Admitting: *Deleted

## 2022-01-13 NOTE — Telephone Encounter (Signed)
Mom reports feeling good. No concerns about herself at this time. EPDS=0 [facility] score=0) Mom reports babies are doing well. Feeding, peeing, and pooping without difficulty. Safe sleep reviewed. Mom reports no concerns about babies at present.  Duffy Rhody, RN 01-13-2022 at 1:35pm

## 2022-01-18 ENCOUNTER — Encounter: Payer: Self-pay | Admitting: Family Medicine

## 2022-01-21 ENCOUNTER — Encounter: Payer: Self-pay | Admitting: Family Medicine

## 2022-01-23 NOTE — Progress Notes (Unsigned)
   GYNECOLOGY OFFICE VISIT NOTE  History:   Megan Shannon is a 26 y.o. P5W6568 here today for IUD check. She had it placed post-placental following SVD for her twins on 7/24.  It felt like something started to poke or pinch her on 8/10.   The following portions of the patient's history were reviewed and updated as appropriate: allergies, current medications, past family history, past medical history, past social history, past surgical history and problem list.   Health Maintenance:   Normal pap  Diagnosis  Date Value Ref Range Status  07/06/2021   Final   - Negative for intraepithelial lesion or malignancy (NILM)   Review of Systems:  Pertinent items noted in HPI and remainder of comprehensive ROS otherwise negative.  Physical Exam:  BP 114/80   Pulse 89   Wt 180 lb 9.6 oz (81.9 kg)   LMP 04/20/2021   Breastfeeding No   BMI 31.00 kg/m  CONSTITUTIONAL: Well-developed, well-nourished female in no acute distress.  HEENT:  Normocephalic, atraumatic. External right and left ear normal. No scleral icterus.  NECK: Normal range of motion, supple, no masses noted on observation SKIN: No rash noted. Not diaphoretic. No erythema. No pallor. MUSCULOSKELETAL: Normal range of motion. No edema noted. NEUROLOGIC: Alert and oriented to person, place, and time. Normal muscle tone coordination. No cranial nerve deficit noted. PSYCHIATRIC: Normal mood and affect. Normal behavior. Normal judgment and thought content.  CARDIOVASCULAR: Normal heart rate noted RESPIRATORY: Effort and breath sounds normal, no problems with respiration noted ABDOMEN: No masses noted. No other overt distention noted.    PELVIC: Normal appearing external genitalia; normal urethral meatus; normal appearing vaginal mucosa and cervix. IUD strings noted to hymenal remnant. No abnormal discharge noted.   Performed in the presence of a chaperone Assessment and Plan:   1. IUD check up - Strings trimmed to 2-3 cm from  cervix - Follow up at postpartum as scheduled  Routine preventative health maintenance measures emphasized. Please refer to After Visit Summary for other counseling recommendations.   No follow-ups on file.  Milas Hock, MD, FACOG Obstetrician & Gynecologist, [facility] for [facility], [facility]

## 2022-01-25 ENCOUNTER — Encounter: Payer: Self-pay | Admitting: Obstetrics and Gynecology

## 2022-01-25 ENCOUNTER — Other Ambulatory Visit: Payer: Self-pay

## 2022-01-25 ENCOUNTER — Ambulatory Visit (INDEPENDENT_AMBULATORY_CARE_PROVIDER_SITE_OTHER): Payer: Medicaid Other | Admitting: Obstetrics and Gynecology

## 2022-01-25 VITALS — BP 114/80 | HR 89 | Wt 180.6 lb

## 2022-01-25 DIAGNOSIS — Z30431 Encounter for routine checking of intrauterine contraceptive device: Secondary | ICD-10-CM

## 2022-02-05 ENCOUNTER — Other Ambulatory Visit: Payer: Self-pay

## 2022-02-05 ENCOUNTER — Ambulatory Visit: Payer: Medicaid Other | Admitting: Obstetrics & Gynecology

## 2022-02-05 DIAGNOSIS — O24410 Gestational diabetes mellitus in pregnancy, diet controlled: Secondary | ICD-10-CM

## 2022-02-19 ENCOUNTER — Other Ambulatory Visit: Payer: Medicaid Other

## 2022-02-19 ENCOUNTER — Ambulatory Visit (INDEPENDENT_AMBULATORY_CARE_PROVIDER_SITE_OTHER): Payer: Medicaid Other | Admitting: Advanced Practice Midwife

## 2022-02-19 ENCOUNTER — Encounter: Payer: Self-pay | Admitting: Advanced Practice Midwife

## 2022-02-19 VITALS — BP 108/70 | HR 75 | Wt 179.9 lb

## 2022-02-19 DIAGNOSIS — Z975 Presence of (intrauterine) contraceptive device: Secondary | ICD-10-CM | POA: Diagnosis not present

## 2022-02-19 DIAGNOSIS — Z3480 Encounter for supervision of other normal pregnancy, unspecified trimester: Secondary | ICD-10-CM

## 2022-02-19 DIAGNOSIS — R7303 Prediabetes: Secondary | ICD-10-CM

## 2022-02-19 DIAGNOSIS — Z23 Encounter for immunization: Secondary | ICD-10-CM | POA: Diagnosis not present

## 2022-02-19 DIAGNOSIS — O24410 Gestational diabetes mellitus in pregnancy, diet controlled: Secondary | ICD-10-CM

## 2022-02-19 DIAGNOSIS — O99119 Other diseases of the blood and blood-forming organs and certain disorders involving the immune mechanism complicating pregnancy, unspecified trimester: Secondary | ICD-10-CM

## 2022-02-19 DIAGNOSIS — D696 Thrombocytopenia, unspecified: Secondary | ICD-10-CM

## 2022-02-19 DIAGNOSIS — O99113 Other diseases of the blood and blood-forming organs and certain disorders involving the immune mechanism complicating pregnancy, third trimester: Secondary | ICD-10-CM

## 2022-02-19 LAB — CBC
Hematocrit: 40 % (ref 34.0–46.6)
Hemoglobin: 13.4 g/dL (ref 11.1–15.9)
MCH: 29 pg (ref 26.6–33.0)
MCHC: 33.5 g/dL (ref 31.5–35.7)
MCV: 87 fL (ref 79–97)
Platelets: 148 10*3/uL — ABNORMAL LOW (ref 150–450)
RBC: 4.62 x10E6/uL (ref 3.77–5.28)
RDW: 13.5 % (ref 11.7–15.4)
WBC: 5.7 10*3/uL (ref 3.4–10.8)

## 2022-02-19 NOTE — Progress Notes (Unsigned)
Post Partum Visit Note  Megan Shannon is a 26 y.o. G20P3004 female who presents for a postpartum visit. She is 6 weeks postpartum following a normal spontaneous vaginal delivery.  I have fully reviewed the prenatal and intrapartum course. The delivery was at [redacted]w[redacted]d gestational weeks.  Anesthesia: epidural. Postpartum course has been uncomplicated. Baby is doing well. Baby is feeding by bottle - Similac Advance. Bleeding staining only. Bowel function is normal. Bladder function is normal. Patient is not sexually active. Contraception method is IUD. Postpartum depression screening: negative.   The pregnancy intention screening data noted above was reviewed. Potential methods of contraception were discussed. The patient elected to proceed with No data recorded.   Edinburgh Postnatal Depression Scale - 02/19/22 0837       Edinburgh Postnatal Depression Scale:  In the Past 7 Days   I have been able to laugh and see the funny side of things. 0    I have looked forward with enjoyment to things. 0    I have blamed myself unnecessarily when things went wrong. 0    I have been anxious or worried for no good reason. 0    I have felt scared or panicky for no good reason. 0    Things have been getting on top of me. 0    I have been so unhappy that I have had difficulty sleeping. 0    I have felt sad or miserable. 0    I have been so unhappy that I have been crying. 0    The thought of harming myself has occurred to me. 0    Edinburgh Postnatal Depression Scale Total 0             Health Maintenance Due  Topic Date Due   URINE MICROALBUMIN  Never done   COVID-19 Vaccine (3 - Pfizer series) 03/14/2020   INFLUENZA VACCINE  01/12/2022    The following portions of the patient's history were reviewed and updated as appropriate: allergies, current medications, past family history, past medical history, past social history, past surgical history, and problem list.  Review of  Systems Pertinent items are noted in HPI.  Objective:  BP 108/70   Pulse 75   Wt 179 lb 14.4 oz (81.6 kg)   LMP 04/20/2021   Breastfeeding No   BMI 30.88 kg/m    General:  alert, cooperative, appears stated age, and no distress   Breasts:  not indicated  Lungs: {lung exam:16931}  Heart:  {heart exam:5510}  Abdomen: {abdomen exam:16834}   Wound {Wound assessment:11097}  GU exam:  {desc; normal/abnormal/not indicated:14647}       Assessment:   1. Benign gestational thrombocytopenia, antepartum (HCC)  - CBC  2. Diet controlled gestational diabetes mellitus (GDM) in third trimester - GTT today  3. IUD (intrauterine device) in place - Strings normal  4. Prediabetes - GTT today  5. Postpartum care and examination - Annual Gyn exam in 1 year   Plan:   Essential components of care per ACOG recommendations:   1.  Mood and well being: Patient with negative depression screening today. Reviewed local resources for support.  - Patient tobacco use? No.   - hx of drug use? No.    2. Infant care and feeding:  -Patient currently breastmilk feeding? No.  -Social determinants of health (SDOH) reviewed in EPIC. No concerns.   3. Sexuality, contraception and birth spacing - Patient does not want a pregnancy in the next year.  Desired family  size is 3 children.  - Reviewed reproductive life planning. Reviewed contraceptive methods based on pt preferences and effectiveness.  Patient desired IUD or IUS today.   - Discussed birth spacing of 18 months  4. Sleep and fatigue -Encouraged family/partner/community support of 4 hrs of uninterrupted sleep to help with mood and fatigue  5. Physical Recovery  - Discussed patients delivery and complications. She describes her labor as mixed. - Patient had a Vaginal, no problems at delivery. Patient had a  no  laceration. Perineal healing reviewed. Patient expressed understanding - Patient has urinary incontinence? No. - Patient is safe to  resume physical and sexual activity  6.  Health Maintenance - HM due items addressed Yes Flu vaccine given  - Last pap smear  Diagnosis  Date Value Ref Range Status  07/06/2021   Final   - Negative for intraepithelial lesion or malignancy (NILM)   Pap smear not done at today's visit. Due 2026 -Breast Cancer screening indicated? No.   7. Chronic Disease/Pregnancy Condition follow up: Gestational Diabetes  - PCP follow up  Dorathy Kinsman, CNM Center for [employer], [facility]

## 2022-02-20 LAB — GLUCOSE TOLERANCE, 2 HOURS
Glucose, 2 hour: 81 mg/dL (ref 70–139)
Glucose, GTT - Fasting: 109 mg/dL — ABNORMAL HIGH (ref 70–99)

## 2022-02-24 ENCOUNTER — Encounter: Payer: Self-pay | Admitting: Advanced Practice Midwife

## 2022-03-01 ENCOUNTER — Encounter: Payer: Self-pay | Admitting: Advanced Practice Midwife

## 2022-03-06 ENCOUNTER — Encounter: Payer: Self-pay | Admitting: Advanced Practice Midwife

## 2022-03-06 DIAGNOSIS — R7301 Impaired fasting glucose: Secondary | ICD-10-CM | POA: Insufficient documentation

## 2022-03-09 ENCOUNTER — Ambulatory Visit: Payer: Medicaid Other | Admitting: Medical

## 2022-03-09 ENCOUNTER — Telehealth: Payer: Self-pay | Admitting: Family Medicine

## 2022-03-09 ENCOUNTER — Ambulatory Visit (INDEPENDENT_AMBULATORY_CARE_PROVIDER_SITE_OTHER): Payer: Medicaid Other | Admitting: Medical

## 2022-03-09 VITALS — BP 119/71 | HR 74

## 2022-03-09 DIAGNOSIS — Z30431 Encounter for routine checking of intrauterine contraceptive device: Secondary | ICD-10-CM

## 2022-03-09 DIAGNOSIS — N939 Abnormal uterine and vaginal bleeding, unspecified: Secondary | ICD-10-CM | POA: Diagnosis not present

## 2022-03-09 NOTE — Progress Notes (Signed)
   History:  [name] is a 26 y.o. [phone] who presents to clinic today for IUD check. Patient had PP IUD placed after birth of twins in July. She returned to office for string check and had strings trimmed. She has continued to have light bleeding daily since the birth of the twins and now states she can feel and see her strings again. She denies pain.    The following portions of the patient's history were reviewed and updated as appropriate: allergies, current medications, family history, past medical history, social history, past surgical history and problem list.  Review of Systems:  Review of Systems  Constitutional:  Negative for fever.  Gastrointestinal:  Negative for abdominal pain.  Genitourinary:        + vaginal bleeding      Objective:  Physical Exam BP 119/71   Pulse 74   Breastfeeding No  Physical Exam Exam conducted with a chaperone present.  Constitutional:      General: She is not in acute distress.    Appearance: Normal appearance.  Cardiovascular:     Rate and Rhythm: Normal rate.  Pulmonary:     Effort: Pulmonary effort is normal.  Genitourinary:    General: Normal vulva.     Vagina: Vaginal discharge (mucous) and bleeding (small) present.     Cervix: Discharge present.     Comments: Strings are longer than expected and were trimmed back to ~ 3 cm from cervical os today. IUD is not visualized in the cervical os Skin:    General: Skin is warm and dry.     Coloration: Skin is not pale.  Neurological:     Mental Status: She is alert and oriented to person, place, and time.  Psychiatric:        Mood and Affect: Mood normal.     Health Maintenance Due  Topic Date Due   COVID-19 Vaccine (3 - Pfizer series) 03/14/2020     Assessment & Plan:  1. Vaginal bleeding - US PELVIC COMPLETE WITH TRANSVAGINAL; Future  2. IUD check up - US PELVIC COMPLETE WITH TRANSVAGINAL; Future  Patient will follow-up depending on results of [place]. May  need IUD removed if indicates malpositioning. Condoms or abstinence recommended in the meantime.   Luvenia Redden, PA-C 03/09/2022 3:49 PM

## 2022-03-09 NOTE — Telephone Encounter (Signed)
Patient called to make an appointment, she is having some issues with her IUD said it may be falling out.

## 2022-03-09 NOTE — Telephone Encounter (Signed)
Called patient and she states she has had her IUD strings trimmed before back in August. Patient states they are now hanging out of her vagina again. Offered appt today at 115. Patient states she will try to make that work. She has to get her son off the bus at 2. She will call back if she cannot do that time.

## 2022-03-15 ENCOUNTER — Ambulatory Visit: Payer: Medicaid Other | Admitting: Family Medicine

## 2022-03-15 ENCOUNTER — Ambulatory Visit
Admission: RE | Admit: 2022-03-15 | Discharge: 2022-03-15 | Disposition: A | Discharge status: Home / Self Care | Payer: Medicaid Other | Source: Ambulatory Visit | Attending: Medical | Admitting: Medical

## 2022-03-15 DIAGNOSIS — Z30431 Encounter for routine checking of intrauterine contraceptive device: Secondary | ICD-10-CM | POA: Diagnosis present

## 2022-03-15 DIAGNOSIS — N939 Abnormal uterine and vaginal bleeding, unspecified: Secondary | ICD-10-CM | POA: Insufficient documentation

## 2022-04-08 ENCOUNTER — Other Ambulatory Visit: Payer: Self-pay | Admitting: Orthopaedic Surgery

## 2022-11-11 ENCOUNTER — Ambulatory Visit: Payer: Medicaid Other | Admitting: Dermatology

## 2022-11-11 ENCOUNTER — Encounter: Payer: Self-pay | Admitting: Dermatology

## 2022-11-11 DIAGNOSIS — L700 Acne vulgaris: Secondary | ICD-10-CM | POA: Diagnosis not present

## 2022-11-11 DIAGNOSIS — L83 Acanthosis nigricans: Secondary | ICD-10-CM | POA: Diagnosis not present

## 2022-11-11 MED ORDER — TRETINOIN 0.025 % EX CREA: TOPICAL_CREAM | CUTANEOUS | 2 refills | Status: DC

## 2022-11-11 MED ORDER — SPIRONOLACTONE 100 MG PO TABS: 100.0000 mg | ORAL_TABLET | Freq: Every day | ORAL | 2 refills | Status: DC

## 2022-11-11 NOTE — Patient Instructions (Signed)
Daily Skincare Regimen: Patient Handout  Morning Routine:  Cleanse: Start your day by washing your face with a gentle cleanser. Choose a product suitable for your skin type, such as CeraVe, Neutrogena, Cetaphil, or LaRoche-Posay. Gently massage the cleanser onto damp skin, then rinse thoroughly with lukewarm water and pat dry with a clean towel.   Moisturize: Finish your morning routine by applying a moisturizer to your face and neck. Opt for a moisturizer that has SPF included, is suitable for your skin type and provides hydration without clogging pores. Consider brands like CeraVe, Neutrogena, Cetaphil, or LaRoche-Posay for effective hydration and skin barrier support.  Evening Routine:  Cleanse: Before bed, cleanse your face again with a gentle cleanser to remove makeup, dirt, and impurities accumulated throughout the day. Use the same cleanser as in the morning and follow the same steps for cleansing.  Apply Medication: Ensure that your skin is completely dry before applying any topical treatments to maximize their efficacy.  Apply prescription medication Tretinoin to the face and Compound lightening cream to the underarm area ONLY THREE NIGHTS A WEEK  Moisturize: After applying any medications, moisturize your skin to seal in hydration and support skin barrier function. Choose a moisturizer suitable for nighttime use that helps replenish moisture overnight. Look for products from trusted brands like CeraVe, Neutrogena, Cetaphil, or LaRoche-Posay for optimal results.   Additional Tips:  Sun Protection: During the daytime, it is essential to apply a broad-spectrum sunscreen with an SPF of 30 or higher to protect your skin from harmful UV rays. Apply sunscreen as the last step of your morning skincare routine and reapply throughout the day as needed, especially if you will be spending time outdoors.  Hydration: Drink plenty of  water throughout the day to keep your skin hydrated from within.  Healthy Lifestyle: Maintain a balanced diet, get regular exercise, manage stress, and prioritize adequate sleep to support overall skin health.   Following a consistent daily skincare regimen can help promote healthy, radiant skin and minimize the risk of common skin issues. Be patient and consistent with your routine, and remember to listen to your skin's needs  Due to recent changes in healthcare laws, you may see results of your pathology and/or laboratory studies on MyChart before the doctors have had a chance to review them. We understand that in some cases there may be results that are confusing or concerning to you. Please understand that not all results are received at the same time and often the doctors may need to interpret multiple results in order to provide you with the best plan of care or course of treatment. Therefore, we ask that you please give [place] 2 business days to thoroughly review all your results before contacting the office for clarification. Should we see a critical lab result, you will be contacted sooner.   If You Need Anything After Your Visit  If you have any questions or concerns for your doctor, please call our main line at [phone] If no one answers, please leave a voicemail as directed and we will return your call as soon as possible. Messages left after 4 pm will be answered the following business day.   You may also send [place] a message via MyChart. We typically respond to MyChart messages within 1-2 business days.  For prescription refills, please ask your pharmacy to contact our office. Our fax number is [phone].  If you have an urgent issue when the clinic is closed that cannot wait until the next  business day, you can page your doctor at the number below.    Please note that while we do our best to be available for urgent issues outside of office hours, we are not available 24/7.   If  you have an urgent issue and are unable to reach [place], you may choose to seek medical care at your doctor's office, retail clinic, urgent care center, or emergency room.  If you have a medical emergency, please immediately call 911 or go to the emergency department. In the event of inclement weather, please call our main line at [phone] for an update on the status of any delays or closures.  Dermatology Medication Tips: Please keep the boxes that topical medications come in in order to help keep track of the instructions about where and how to use these. Pharmacies typically print the medication instructions only on the boxes and not directly on the medication tubes.   If your medication is too expensive, please contact our office at [phone] or send [place] a message through MyChart.   We are unable to tell what your co-pay for medications will be in advance as this is different depending on your insurance coverage. However, we may be able to find a substitute medication at lower cost or fill out paperwork to get insurance to cover a needed medication.   If a prior authorization is required to get your medication covered by your insurance company, please allow [place] 1-2 business days to complete this process.  Drug prices often vary depending on where the prescription is filled and some pharmacies may offer cheaper prices.  The website www.goodrx.com contains coupons for medications through different pharmacies. The prices here do not account for what the cost may be with help from insurance (it may be cheaper with your insurance), but the website can give you the price if you did not use any insurance.  - You can print the associated coupon and take it with your prescription to the pharmacy.  - You may also stop by our office during regular business hours and pick up a GoodRx coupon card.  - If you need your prescription sent electronically to a different pharmacy, notify our office through [facility] or by phone at [phone]

## 2022-11-11 NOTE — Progress Notes (Signed)
   New Patient Visit   Subjective  Megan Shannon is a 27 y.o. female who presents for the following: Hyperpigmentation  Patient has struggled with acne since she was a teenager. Acne has cleared for the most part but the scarring and dark spots are still there. She has previously seen a dermatologist in 2015. Patient has used exfoliating scrubs, dark spot correctors, and lemon juice, and tumeric. Cleansers are vitamin c brightening soap and cereve moisturizer    The following portions of the chart were reviewed this encounter and updated as appropriate: medications, allergies, medical history  Review of Systems:  No other skin or systemic complaints except as noted in HPI or Assessment and Plan.  Objective  Well appearing patient in no apparent distress; mood and affect are within normal limits.  A focused examination was performed of the following areas: Face  Relevant exam findings are noted in the Assessment and Plan.    Assessment & Plan   ACNE VULGARIS Exam: Open comedones and inflammatory papules  Flared  Treatment Plan: -Discussed starting Sprionolactone 100mg  tablets once a day. Patient agrees.  -Tretinoin to use a pea size amount three nights a week. Moisturizer before and after application  Spironolactone can cause increased urination and cause blood pressure to decrease. Please watch for signs of lightheadedness and be cautious when changing position. It can sometimes cause breast tenderness or an irregular period in premenopausal women. It can also increase potassium. The increase in potassium usually is not a concern unless you are taking other medicines that also increase potassium, so please be sure your doctor knows all of the other medications you are taking. This medication should not be taken by pregnant women.  This medicine should also not be taken together with sulfa drugs like Bactrim (trimethoprim/sulfamethexazole).    Acanthosis Nigricans Exam:  velvety papillomatous overgrowth of the epidermis.  Discussed with patient this came from gestational diabetes while pregnant. Discussed keeping a good diet, exercise, and drink plenty of water.   Treatment Plan: Prescribing lightening cream to use under the arms only three nights a week. This will come from a compounding pharmacy     Return in about 3 months (around 02/11/2023) for acne follow up.    Documentation: I have reviewed the above documentation for accuracy and completeness, and I agree with the above.  Langston Reusing, DO  I, Germaine Pomfret, CMA, am acting as scribe for [employer], DO.

## 2022-11-19 ENCOUNTER — Encounter: Payer: Self-pay | Admitting: Dermatology

## 2022-11-22 ENCOUNTER — Telehealth: Payer: Self-pay

## 2022-11-22 NOTE — Telephone Encounter (Signed)
Rx was sent  

## 2022-11-22 NOTE — Telephone Encounter (Signed)
Good morning Dr. Onalee Hua, This person is waiting for compounded skin lightening cream. Her email:  Email  [email or web address]

## 2023-02-16 ENCOUNTER — Ambulatory Visit: Payer: Medicaid Other | Admitting: Dermatology

## 2023-02-21 ENCOUNTER — Encounter: Payer: Self-pay | Admitting: Dermatology

## 2023-02-21 ENCOUNTER — Ambulatory Visit: Payer: Medicaid Other | Admitting: Dermatology

## 2023-02-21 ENCOUNTER — Ambulatory Visit (INDEPENDENT_AMBULATORY_CARE_PROVIDER_SITE_OTHER): Payer: Medicaid Other | Admitting: Dermatology

## 2023-02-21 DIAGNOSIS — L83 Acanthosis nigricans: Secondary | ICD-10-CM | POA: Diagnosis not present

## 2023-02-21 DIAGNOSIS — L700 Acne vulgaris: Secondary | ICD-10-CM

## 2023-02-21 MED ORDER — TRETINOIN 0.025 % EX CREA: TOPICAL_CREAM | CUTANEOUS | 2 refills | Status: DC

## 2023-02-21 MED ORDER — SPIRONOLACTONE 50 MG PO TABS: 150.0000 mg | ORAL_TABLET | Freq: Every day | ORAL | 6 refills | Status: DC

## 2023-02-21 NOTE — Patient Instructions (Addendum)
Hi Megan Shannon,  Thank you for visiting [place] today. We appreciate your dedication to improving your health and managing your skin conditions. Here is a summary of the key instructions from today's consultation:  - Spironolactone Dosage: Increase from 100 mg to 150 mg daily. Take three 50 mg tablets each day. Ensure adequate water intake.  - Tretinoin Application: Reduce usage to every other night to lessen peeling and burning. Apply over a base of hyaluronic acid. We provided samples of La Roche-Posay.  - Skin Care Routine:   - Morning and Night: Wash face, apply hyaluronic acid, followed by tretinoin or moisturizer.   - Sunscreen: Use CeraVe sunscreen in the morning.   - Weekends: Apply glycolic or salicylic acid toner on the nose.  - Weight Management: Focus on exercise, particularly after meals, and reduce intake of sugary drinks and processed foods to help manage acanthosis nigricans and potential pre-diabetes.  - Medication Refills: Prescription for tretinoin has been sent to your pharmacy.  - Follow-Up Appointment: Scheduled for April 2024, when we may consider increasing the strength of tretinoin.  Please remember to stay hydrated and continue with the modified application of tretinoin to manage dryness and irritation effectively. If you encounter any issues before your next appointment, feel free to send a message through MyChart.   Best regards,  Dr. Langston Reusing Dermat[facility]   Important Information  Due to recent changes in healthcare laws, you may see results of your pathology and/or laboratory studies on MyChart before the doctors have had a chance to review them. We understand that in some cases there may be results that are confusing or concerning to you. Please understand that not all results are received at the same time and often the doctors may need to interpret multiple results in order to provide you with the best plan of care or course of treatment. Therefore, we ask that  you please give [place] 2 business days to thoroughly review all your results before contacting the office for clarification. Should we see a critical lab result, you will be contacted sooner.   If You Need Anything After Your Visit  If you have any questions or concerns for your doctor, please call our main line at [phone] If no one answers, please leave a voicemail as directed and we will return your call as soon as possible. Messages left after 4 pm will be answered the following business day.   You may also send [place] a message via MyChart. We typically respond to MyChart messages within 1-2 business days.  For prescription refills, please ask your pharmacy to contact our office. Our fax number is [phone].  If you have an urgent issue when the clinic is closed that cannot wait until the next business day, you can page your doctor at the number below.    Please note that while we do our best to be available for urgent issues outside of office hours, we are not available 24/7.   If you have an urgent issue and are unable to reach [place], you may choose to seek medical care at your doctor's office, retail clinic, urgent care center, or emergency room.  If you have a medical emergency, please immediately call 911 or go to the emergency department. In the event of inclement weather, please call our main line at [phone] for an update on the status of any delays or closures.  Dermatology Medication Tips: Please keep the boxes that topical medications come in in order to help keep track  of the instructions about where and how to use these. Pharmacies typically print the medication instructions only on the boxes and not directly on the medication tubes.   If your medication is too expensive, please contact our office at [phone] or send [place] a message through MyChart.   We are unable to tell what your co-pay for medications will be in advance as this is different depending on your insurance  coverage. However, we may be able to find a substitute medication at lower cost or fill out paperwork to get insurance to cover a needed medication.   If a prior authorization is required to get your medication covered by your insurance company, please allow [place] 1-2 business days to complete this process.  Drug prices often vary depending on where the prescription is filled and some pharmacies may offer cheaper prices.  The website www.goodrx.com contains coupons for medications through different pharmacies. The prices here do not account for what the cost may be with help from insurance (it may be cheaper with your insurance), but the website can give you the price if you did not use any insurance.  - You can print the associated coupon and take it with your prescription to the pharmacy.  - You may also stop by our office during regular business hours and pick up a GoodRx coupon card.  - If you need your prescription sent electronically to a different pharmacy, notify our office through [facility] or by phone at [phone]

## 2023-02-21 NOTE — Progress Notes (Signed)
Follow-Up Visit   Subjective  Megan Shannon is a 27 y.o. female who presents for the following: 3 month follow up. For acne patient is taking spironolactone 100 mg with no side effects and using tretinoin 0.025% nightly with some mild burning and peeling. Patient feels that acne is sometimes better but she is still continuing to flare. Acanthosis nigricans follow up at axilla, using [employer] lightning cream 3 nights weekly, lightens and then darkens again. Needs refills. She is also using glycolic acid on opposite nights.   The patient has spots, moles and lesions to be evaluated, some may be new or changing and the patient may have concern these could be cancer.   The following portions of the chart were reviewed this encounter and updated as appropriate: medications, allergies, medical history  Review of Systems:  No other skin or systemic complaints except as noted in HPI or Assessment and Plan.  Objective  Well appearing patient in no apparent distress; mood and affect are within normal limits.   A focused examination was performed of the following areas: Face, axilla  Relevant exam findings are noted in the Assessment and Plan.    Assessment & Plan   ACNE VULGARIS Exam: Open comedones and inflammatory papules  Chronic and persistent condition with duration or expected duration over one year. Condition is symptomatic/ bothersome to patient. Not currently at goal.   Treatment Plan: Continue spironolactone increasing to 150 mg every day. Continue tretinoin 0.025% decreasing to qohs. May apply over hyaluronic acid. Samples of La Roche Posay B5 given to patient.  Will plan to increase tretinoin in the spring.   Recommend : -Morning and Night: Wash face, apply hyaluronic acid, followed by tretinoin or moisturizer.   - Sunscreen: Use CeraVe sunscreen in the morning.   - Weekends: Apply glycolic or salicylic acid toner on the nose.   Topical retinoid  medications like tretinoin/Retin-A, adapalene/Differin, tazarotene/Fabior, and Epiduo/Epiduo Forte can cause dryness and irritation when first started. Only apply a pea-sized amount to the entire affected area. Avoid applying it around the eyes, edges of mouth and creases at the nose. If you experience irritation, use a good moisturizer first and/or apply the medicine less often. If you are doing well with the medicine, you can increase how often you use it until you are applying every night. Be careful with sun protection while using this medication as it can make you sensitive to the sun. This medicine should not be used by pregnant women.   Spironolactone can cause increased urination and cause blood pressure to decrease. Please watch for signs of lightheadedness and be cautious when changing position. It can sometimes cause breast tenderness or an irregular period in premenopausal women. It can also increase potassium. The increase in potassium usually is not a concern unless you are taking other medicines that also increase potassium, so please be sure your doctor knows all of the other medications you are taking. This medication should not be taken by pregnant women.  This medicine should also not be taken together with sulfa drugs like Bactrim (trimethoprim/sulfamethexazole).    Acanthosis Nigracans Exam: velvety papillomatous overgrowth of the epidermis, improved.    - Assessment: Treatment ongoing with skin medication and glycolic acid. - Plan: Continue skin medication three nights a week and glycolic acid on alternate evenings. Encourage weight loss through exercise and dietary changes. Monitor for signs of pre-diabetes. Expect improvement with continued weight loss.  3. Dryness and Irritation - Assessment: Experiencing dryness and irritation  possibly due to tretinoin use. - Plan: Apply hyaluronic acid (La Roche-Posay) every night and morning before tretinoin or moisturizer. Use tretinoin  every other night to reduce burning.   Return in about 7 months (around 09/21/2023) for acne.  Anise Salvo, RMA, am acting as scribe for [employer], DO .   Documentation: I have reviewed the above documentation for accuracy and completeness, and I agree with the above.  Langston Reusing, DO

## 2023-02-23 ENCOUNTER — Encounter: Payer: Self-pay | Admitting: Dermatology

## 2023-02-23 ENCOUNTER — Ambulatory Visit: Payer: Medicaid Other | Admitting: Dermatology

## 2023-03-16 NOTE — Therapy (Signed)
OUTPATIENT PHYSICAL THERAPY FEMALE PELVIC EVALUATION   Patient Name: Megan Shannon MRN: [id number] DOB:[birth year], 27 y.o., female Today's Date: 03/17/2023  END OF SESSION:  PT End of Session - 03/17/23 1742     Visit Number 1    Number of Visits 8    Authorization Type Medicaid [employer]    Authorization - Visit Number 1    PT Start Time 1617    PT Stop Time 1702    PT Time Calculation (min) 45 min    Activity Tolerance Patient tolerated treatment well    Behavior During Therapy [facility] for tasks assessed/performed             Past Medical History:  Diagnosis Date   Atypical chest pain    Cholestasis during pregnancy in third trimester 12/11/2021   Diabetes mellitus without complication (HCC)    Gestational diabetes    History of gestational diabetes 11/12/2021   Seasonal allergies    Past Surgical History:  Procedure Laterality Date   WISDOM TOOTH EXTRACTION     Patient Active Problem List   Diagnosis Date Noted   Impaired fasting glucose 03/06/2022   IUD (intrauterine device) in place 01/04/2022   Gestational thrombocytopenia (HCC) 12/14/2021   History of cholestasis during pregnancy 12/11/2021   History of gestational diabetes 11/12/2021   Prediabetes 08/03/2021    PCP: Irven Coe, MD PCP - General  REFERRING PROVIDER: Estill Bamberg, MD Ref Provider  REFERRING DIAG: M54.50 (ICD-10-CM) - Low back pain M53.3 (ICD-10-CM) - SI (sacroiliac) joint dysfunction  THERAPY DIAG:  Muscle weakness (generalized)  Muscle spasm of back  Rationale for Evaluation and Treatment: Rehabilitation  ONSET DATE: 2017  SUBJECTIVE:                                                                                                                                                                                           SUBJECTIVE STATEMENT: Pt reports that she had a work injury 5-7 years ago ( pt cannot quite remember)  she was lifting a patient at work, she hurt her  back, did PT, got better- about 80%. She recently gave birth to twins, it got worse. She was sent here for low back and pelvic PT, she is not sure why. Her main complaint is her low back, her pain is not 24/7, sometimes her back gives out and she has to hold onto things.  She has had 4 vaginal deliveries. Kids ages 35,4 and 63.  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   Fluid intake: Yes:      PAIN:  Across low back, more on right PRECAUTIONS: None  RED FLAGS: None   WEIGHT BEARING RESTRICTIONS: No  FALLS:  Has patient fallen in last 6 months? No  LIVING ENVIRONMENT: Lives with: lives with their family Lives in: House/apartment Stairs: No Has following equipment at home: None  OCCUPATION: stay at home mom  PLOF: Independent  PATIENT GOALS: to no have low back pain  PERTINENT HISTORY:   Sexual abuse: No  BOWEL MOVEMENT: Pain with bowel movement: No Type of bowel movement:Type (Bristol Stool Scale) 3-4 Fully empty rectum: Yes:   Leakage: No Pads: No Fiber supplement: No  URINATION: Pain with urination: No Fully empty bladder: Yes:   Stream: Strong Urgency: No Frequency: wfl Leakage:  no Pads: No  INTERCOURSE: Pain with intercourse:  no Ability to have vaginal penetration:  Yes:       PREGNANCY: Vaginal deliveries 4 Tearing No C-section deliveries 0 Currently pregnant No   OBJECTIVE:  Note: Objective measures were completed at Evaluation unless otherwise noted.  DIAGNOSTIC FINDINGS:  Tight and tender TP right left paraspinals  PATIENT SURVEYS:  18 / 50 = 36.0 % Oswestry  COGNITION: Overall cognitive status: Within functional limits for tasks assessed     SENSATION: Light touch: Appears intact Proprioception: Appears intact  LUMBAR SPECIAL TESTS:  Straight leg raise test: Negative, weak and guarded   POSTURE: decreased lumbar lordosis and posterior pelvic tilt  PELVIC ALIGNMENT:  LUMBARAROM/PROM:  A/PROM A/PROM  eval  Flexion Guarded  movement  Extension Seems WFL  Right lateral flexion Seems WFL  Left lateral flexion Seems WFL  Right rotation   Left rotation    (Blank rows = not tested)  LOWER EXTREMITY ROM:  Passive ROM Right eval Left eval  Hip flexion tight tight  Hip extension    Hip abduction    Hip adduction    Hip internal rotation    Hip external rotation    Knee flexion    Knee extension    Ankle dorsiflexion    Ankle plantarflexion    Ankle inversion    Ankle eversion     (Blank rows = not tested)  LOWER EXTREMITY MMT:  MMT Right eval Left eval  Hip flexion 4-/5 4/5  Hip extension    Hip abduction    Hip adduction    Hip internal rotation    Hip external rotation    Knee flexion    Knee extension 5/5 5/5  Ankle dorsiflexion    Ankle plantarflexion    Ankle inversion    Ankle eversion             TODAY'S TREATMENT:                                                                                                                              DATE:   EVAL see above   PATIENT EDUCATION:  Education details: relevant anatomy, exercises, dry needling Person educated: Patient Education method: Explanation and Handouts Education comprehension: verbalized understanding  HOME EXERCISE  PROGRAM: Bridging with hip add with ball Figure four stretch Single knee to chest stretch  ASSESSMENT:  CLINICAL IMPRESSION: Patient is a 27 y.o. F who was seen today for physical therapy evaluation and treatment for low back pain. She presents with weakness in her low back and bilat hips, more on right, guarded movement and difficulty with engaging core and transfers. She has had a fear of moving and seems like she has not strengthened postpartum ( 4 children, most recent twins about a year ago). She did well today with education, exercises to engage her core and trial of dry needling right lumbar paraspinals. She does not have pelvic floor symptoms, discussed seeing an ortho therapist. She will be  scheduled with one. She will benefit from PT to return to better quality of life.   OBJECTIVE IMPAIRMENTS: decreased mobility, decreased ROM, decreased strength, increased muscle spasms, impaired flexibility, and pain.   ACTIVITY LIMITATIONS: carrying, bending, standing, and caring for others  PARTICIPATION LIMITATIONS: occupation  PERSONAL FACTORS: Fitness and Time since onset of injury/illness/exacerbation are also affecting patient's functional outcome.   REHAB POTENTIAL: Good  CLINICAL DECISION MAKING: Stable/uncomplicated  EVALUATION COMPLEXITY: Low   GOALS: Goals reviewed with patient? Yes  SHORT TERM GOALS: Target date: 04/14/2023    Pt will be independent with HEP.   Baseline: Goal status: INITIAL  2.  Pt will report max 3/10 pain Baseline:  Goal status: INITIAL  3.  Pt will be able to stand and cook for at least 30 mins without increased pain Baseline:  Goal status: INITIAL    LONG TERM GOALS: Target date: 06/09/2023    Pt will be able to lie on the floor and get up without LBP Baseline:  Goal status: INITIAL  2.  Pt will have decreased Oswestry by at least 20% Baseline: 36% Goal status: INITIAL  3.  Pt will improve bilat hip strength and core strength by 1 grade at least to reduce LBP Baseline:  Goal status: INITIAL   PLAN:  PT FREQUENCY: 1-2x/week  PT DURATION: 12 weeks  PLANNED INTERVENTIONS: Therapeutic exercises, Therapeutic activity, Neuromuscular re-education, Balance training, Gait training, Patient/Family education, Self Care, Joint mobilization, Aquatic Therapy, Dry Needling, Electrical stimulation, Spinal manipulation, Spinal mobilization, and Manual therapy  PLAN FOR NEXT SESSION: cont low back and hip strengthening   Jayel Inks, PT 03/17/2023, 5:45 PM

## 2023-03-17 ENCOUNTER — Other Ambulatory Visit: Payer: Self-pay

## 2023-03-17 ENCOUNTER — Encounter: Payer: Self-pay | Admitting: Physical Therapy

## 2023-03-17 ENCOUNTER — Ambulatory Visit: Payer: Medicaid Other | Attending: Orthopedic Surgery | Admitting: Physical Therapy

## 2023-03-17 DIAGNOSIS — M545 Low back pain, unspecified: Secondary | ICD-10-CM | POA: Diagnosis present

## 2023-03-17 DIAGNOSIS — M62830 Muscle spasm of back: Secondary | ICD-10-CM | POA: Insufficient documentation

## 2023-03-17 DIAGNOSIS — M6281 Muscle weakness (generalized): Secondary | ICD-10-CM | POA: Diagnosis not present

## 2023-03-17 DIAGNOSIS — M533 Sacrococcygeal disorders, not elsewhere classified: Secondary | ICD-10-CM | POA: Insufficient documentation

## 2023-03-24 ENCOUNTER — Ambulatory Visit: Payer: Medicaid Other | Admitting: Physical Therapy

## 2023-03-24 ENCOUNTER — Encounter: Payer: Self-pay | Admitting: Physical Therapy

## 2023-03-24 ENCOUNTER — Encounter: Payer: Medicaid Other | Admitting: Physical Therapy

## 2023-03-24 DIAGNOSIS — M6281 Muscle weakness (generalized): Secondary | ICD-10-CM

## 2023-03-24 DIAGNOSIS — M545 Low back pain, unspecified: Secondary | ICD-10-CM | POA: Diagnosis not present

## 2023-03-24 DIAGNOSIS — M62830 Muscle spasm of back: Secondary | ICD-10-CM

## 2023-03-24 NOTE — Therapy (Signed)
OUTPATIENT PHYSICAL THERAPY LUMBAR TREATMENT NOTE   Patient Name: Megan Shannon MRN: [id number] DOB:1996-05-10, 27 y.o., female Today's Date: 03/24/2023  END OF SESSION:  PT End of Session - 03/24/23 1459     Visit Number 2    Number of Visits 8    Authorization Type Medicaid [employer]    PT Start Time 1232    PT Stop Time 1313    PT Time Calculation (min) 41 min    Activity Tolerance Patient tolerated treatment well    Behavior During Therapy [facility] for tasks assessed/performed              Past Medical History:  Diagnosis Date   Atypical chest pain    Cholestasis during pregnancy in third trimester 12/11/2021   Diabetes mellitus without complication (HCC)    Gestational diabetes    History of gestational diabetes 11/12/2021   Seasonal allergies    Past Surgical History:  Procedure Laterality Date   WISDOM TOOTH EXTRACTION     Patient Active Problem List   Diagnosis Date Noted   Impaired fasting glucose 03/06/2022   IUD (intrauterine device) in place 01/04/2022   Gestational thrombocytopenia (HCC) 12/14/2021   History of cholestasis during pregnancy 12/11/2021   History of gestational diabetes 11/12/2021   Prediabetes 08/03/2021    PCP: Irven Coe, MD PCP - General  REFERRING PROVIDER: Estill Bamberg, MD Ref Provider  REFERRING DIAG: M54.50 (ICD-10-CM) - Low back pain M53.3 (ICD-10-CM) - SI (sacroiliac) joint dysfunction  THERAPY DIAG:  Muscle weakness (generalized)  Muscle spasm of back  Rationale for Evaluation and Treatment: Rehabilitation  ONSET DATE: 2017  SUBJECTIVE:                                                                                                                                                                                           SUBJECTIVE STATEMENT: Patient reports she is doing good today. She is not currently having any back pain. She normally feels her back pain with bending and lifting. Her pain is normally  an off and on. She responded well to needling last times.   From Eval: Pt reports that she had a work injury 5-7 years ago ( pt cannot quite remember)  she was lifting a patient at work, she hurt her back, did PT, got better- about 80%. She recently gave birth to twins, it got worse. She was sent here for low back and pelvic PT, she is not sure why. Her main complaint is her low back, her pain is not 24/7, sometimes her back gives out and she has to hold onto things.  She has had  4 vaginal deliveries. Kids ages 25,4 and 82.  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   Fluid intake: Yes:      PAIN:  Across low back, more on right PRECAUTIONS: None  RED FLAGS: None   WEIGHT BEARING RESTRICTIONS: No  FALLS:  Has patient fallen in last 6 months? No  LIVING ENVIRONMENT: Lives with: lives with their family Lives in: House/apartment Stairs: No Has following equipment at home: None  OCCUPATION: stay at home mom  PLOF: Independent  PATIENT GOALS: to no have low back pain  PERTINENT HISTORY:   Sexual abuse: No  BOWEL MOVEMENT: Pain with bowel movement: No Type of bowel movement:Type (Bristol Stool Scale) 3-4 Fully empty rectum: Yes:   Leakage: No Pads: No Fiber supplement: No  URINATION: Pain with urination: No Fully empty bladder: Yes:   Stream: Strong Urgency: No Frequency: wfl Leakage:  no Pads: No  INTERCOURSE: Pain with intercourse:  no Ability to have vaginal penetration:  Yes:       PREGNANCY: Vaginal deliveries 4 Tearing No C-section deliveries 0 Currently pregnant No   OBJECTIVE:  Note: Objective measures were completed at Evaluation unless otherwise noted.  DIAGNOSTIC FINDINGS:  Tight and tender TP right left paraspinals  PATIENT SURVEYS:  18 / 50 = 36.0 % Oswestry  COGNITION: Overall cognitive status: Within functional limits for tasks assessed     SENSATION: Light touch: Appears intact Proprioception: Appears intact  LUMBAR SPECIAL TESTS:   Straight leg raise test: Negative, weak and guarded   POSTURE: decreased lumbar lordosis and posterior pelvic tilt  PELVIC ALIGNMENT:  LUMBARAROM/PROM:  A/PROM A/PROM  eval  Flexion Guarded movement  Extension Seems WFL  Right lateral flexion Seems WFL  Left lateral flexion Seems WFL  Right rotation   Left rotation    (Blank rows = not tested)  LOWER EXTREMITY ROM:  Passive ROM Right eval Left eval  Hip flexion tight tight  Hip extension    Hip abduction    Hip adduction    Hip internal rotation    Hip external rotation    Knee flexion    Knee extension    Ankle dorsiflexion    Ankle plantarflexion    Ankle inversion    Ankle eversion     (Blank rows = not tested)  LOWER EXTREMITY MMT:  MMT Right eval Left eval  Hip flexion 4-/5 4/5  Hip extension    Hip abduction    Hip adduction    Hip internal rotation    Hip external rotation    Knee flexion    Knee extension 5/5 5/5  Ankle dorsiflexion    Ankle plantarflexion    Ankle inversion    Ankle eversion             TODAY'S TREATMENT:  DATE:   03/24/2023 NuStep Level 4 6 Minutes- PT present to discuss progress Reviewed HEP and updated to add core exercises Posterior Pelvic Tilts x 10  Supine marches x 10 each LE Alt hand and knee press x 10 each Trigger Point Dry-Needling Performed by Jitka Helmus Treatment instructions: Expect mild to moderate muscle soreness. S/S of pneumothorax if dry needled over a lung field, and to seek immediate medical attention should they occur. Patient verbalized understanding of these instructions and education.  Patient Consent Given: Yes Education handout provided: Previously provided Muscles treated: Rt piriformis; Rt & lumbar multifids  Electrical stimulation performed: No Parameters: N/A Treatment response/outcome: Palpable muscle twitches  were felt Manual therapy: STM to lumbar region and Rt piriformis     PATIENT EDUCATION:  Education details: relevant anatomy, exercises, dry needling Person educated: Patient Education method: Explanation and Handouts Education comprehension: verbalized understanding  HOME EXERCISE PROGRAM: Access Code: [email or web address] URL: [email or web address] Date: 03/24/2023 Prepared by: Claude Manges  Exercises - Beginner Bridge  - 1 x daily - 7 x weekly - 2 sets - 10 reps - Hip Flexion Stretch  - 1 x daily - 7 x weekly - 2 sets - 8 reps - 5 hold - Seated Piriformis Stretch with Trunk Bend  - 1 x daily - 7 x weekly - 2 sets - 20-30 hold - Supine Posterior Pelvic Tilt  - 1 x daily - 7 x weekly - 2 sets - 10 reps  ASSESSMENT:  CLINICAL IMPRESSION: Today's treatment session focused on lumbar mobility and core strengthening. Reviewed patient's HEP to ensure proper performance and updated it to include core strengthening. Patient required verbal and tactile cues for proper TA activation. Patient responded favorably to trigger point dry needling and palpable muscle twitches were felt. Educated patient to stretch and stay hydrated the rest of the day. Patient will benefit from skilled PT to address the below impairments and improve overall function.    OBJECTIVE IMPAIRMENTS: decreased mobility, decreased ROM, decreased strength, increased muscle spasms, impaired flexibility, and pain.   ACTIVITY LIMITATIONS: carrying, bending, standing, and caring for others  PARTICIPATION LIMITATIONS: occupation  PERSONAL FACTORS: Fitness and Time since onset of injury/illness/exacerbation are also affecting patient's functional outcome.   REHAB POTENTIAL: Good  CLINICAL DECISION MAKING: Stable/uncomplicated  EVALUATION COMPLEXITY: Low   GOALS: Goals reviewed with patient? Yes  SHORT TERM GOALS: Target date: 04/14/2023    Pt will be independent with HEP.   Baseline: Goal status: INITIAL  2.   Pt will report max 3/10 pain Baseline:  Goal status: INITIAL  3.  Pt will be able to stand and cook for at least 30 mins without increased pain Baseline:  Goal status: INITIAL    LONG TERM GOALS: Target date: 06/09/2023    Pt will be able to lie on the floor and get up without LBP Baseline:  Goal status: INITIAL  2.  Pt will have decreased Oswestry by at least 20% Baseline: 36% Goal status: INITIAL  3.  Pt will improve bilat hip strength and core strength by 1 grade at least to reduce LBP Baseline:  Goal status: INITIAL   PLAN:  PT FREQUENCY: 1-2x/week  PT DURATION: 12 weeks  PLANNED INTERVENTIONS: Therapeutic exercises, Therapeutic activity, Neuromuscular re-education, Balance training, Gait training, Patient/Family education, Self Care, Joint mobilization, Aquatic Therapy, Dry Needling, Electrical stimulation, Spinal manipulation, Spinal mobilization, and Manual therapy  PLAN FOR NEXT SESSION: continue core strengthening, hip strengthening, squat technique    Claude Manges, PT 03/24/23 3:00  PM

## 2023-03-29 ENCOUNTER — Ambulatory Visit: Payer: Medicaid Other | Admitting: Physical Therapy

## 2023-03-29 ENCOUNTER — Encounter: Payer: Self-pay | Admitting: Physical Therapy

## 2023-03-29 DIAGNOSIS — M545 Low back pain, unspecified: Secondary | ICD-10-CM | POA: Diagnosis not present

## 2023-03-29 DIAGNOSIS — M6281 Muscle weakness (generalized): Secondary | ICD-10-CM

## 2023-03-29 DIAGNOSIS — M62830 Muscle spasm of back: Secondary | ICD-10-CM

## 2023-03-29 NOTE — Therapy (Signed)
OUTPATIENT PHYSICAL THERAPY LUMBAR TREATMENT NOTE   Patient Name: Megan Shannon MRN: [id number] DOB:Mar 26, 1996, 27 y.o., female Today's Date: 03/29/2023  END OF SESSION:  PT End of Session - 03/29/23 0929     Visit Number 3    Number of Visits 8    Authorization Type Medicaid [employer]    Authorization - Visit Number 2    PT Start Time 0845    PT Stop Time 0928    PT Time Calculation (min) 43 min    Activity Tolerance Patient tolerated treatment well    Behavior During Therapy [facility] for tasks assessed/performed               Past Medical History:  Diagnosis Date   Atypical chest pain    Cholestasis during pregnancy in third trimester 12/11/2021   Diabetes mellitus without complication (HCC)    Gestational diabetes    History of gestational diabetes 11/12/2021   Seasonal allergies    Past Surgical History:  Procedure Laterality Date   WISDOM TOOTH EXTRACTION     Patient Active Problem List   Diagnosis Date Noted   Impaired fasting glucose 03/06/2022   IUD (intrauterine device) in place 01/04/2022   Gestational thrombocytopenia (HCC) 12/14/2021   History of cholestasis during pregnancy 12/11/2021   History of gestational diabetes 11/12/2021   Prediabetes 08/03/2021    PCP: Irven Coe, MD PCP - General  REFERRING PROVIDER: Estill Bamberg, MD Ref Provider  REFERRING DIAG: M54.50 (ICD-10-CM) - Low back pain M53.3 (ICD-10-CM) - SI (sacroiliac) joint dysfunction  THERAPY DIAG:  Muscle weakness (generalized)  Muscle spasm of back  Rationale for Evaluation and Treatment: Rehabilitation  ONSET DATE: 2017  SUBJECTIVE:                                                                                                                                                                                           SUBJECTIVE STATEMENT: Patient reports she is doing okay today. Her back is usually more painful in the mornings. Currently it is 5/10. DN was  beneficial last treatment session she had relief all weekend.   From Eval: Pt reports that she had a work injury 5-7 years ago ( pt cannot quite remember)  she was lifting a patient at work, she hurt her back, did PT, got better- about 80%. She recently gave birth to twins, it got worse. She was sent here for low back and pelvic PT, she is not sure why. Her main complaint is her low back, her pain is not 24/7, sometimes her back gives out and she has to hold onto things.  She has had  4 vaginal deliveries. Kids ages 97,4 and 4.  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   Fluid intake: Yes:      PAIN:  Across low back, more on right PRECAUTIONS: None  RED FLAGS: None   WEIGHT BEARING RESTRICTIONS: No  FALLS:  Has patient fallen in last 6 months? No  LIVING ENVIRONMENT: Lives with: lives with their family Lives in: House/apartment Stairs: No Has following equipment at home: None  OCCUPATION: stay at home mom  PLOF: Independent  PATIENT GOALS: to no have low back pain  PERTINENT HISTORY:   Sexual abuse: No  BOWEL MOVEMENT: Pain with bowel movement: No Type of bowel movement:Type (Bristol Stool Scale) 3-4 Fully empty rectum: Yes:   Leakage: No Pads: No Fiber supplement: No  URINATION: Pain with urination: No Fully empty bladder: Yes:   Stream: Strong Urgency: No Frequency: wfl Leakage:  no Pads: No  INTERCOURSE: Pain with intercourse:  no Ability to have vaginal penetration:  Yes:       PREGNANCY: Vaginal deliveries 4 Tearing No C-section deliveries 0 Currently pregnant No   OBJECTIVE:  Note: Objective measures were completed at Evaluation unless otherwise noted.  DIAGNOSTIC FINDINGS:  Tight and tender TP right left paraspinals  PATIENT SURVEYS:  18 / 50 = 36.0 % Oswestry  COGNITION: Overall cognitive status: Within functional limits for tasks assessed     SENSATION: Light touch: Appears intact Proprioception: Appears intact  LUMBAR SPECIAL TESTS:   Straight leg raise test: Negative, weak and guarded   POSTURE: decreased lumbar lordosis and posterior pelvic tilt  PELVIC ALIGNMENT:  LUMBARAROM/PROM:  A/PROM A/PROM  eval  Flexion Guarded movement  Extension Seems WFL  Right lateral flexion Seems WFL  Left lateral flexion Seems WFL  Right rotation   Left rotation    (Blank rows = not tested)  LOWER EXTREMITY ROM:  Passive ROM Right eval Left eval  Hip flexion tight tight  Hip extension    Hip abduction    Hip adduction    Hip internal rotation    Hip external rotation    Knee flexion    Knee extension    Ankle dorsiflexion    Ankle plantarflexion    Ankle inversion    Ankle eversion     (Blank rows = not tested)  LOWER EXTREMITY MMT:  MMT Right eval Left eval  Hip flexion 4-/5 4/5  Hip extension    Hip abduction    Hip adduction    Hip internal rotation    Hip external rotation    Knee flexion    Knee extension 5/5 5/5  Ankle dorsiflexion    Ankle plantarflexion    Ankle inversion    Ankle eversion             TODAY'S TREATMENT:  DATE:   03/29/2023 NuStep Level 5 6 Minutes- PT present to discuss progress 3 way SB ball stretch x 8 each Hamstring stretch 2 x 30 sec each Seated piriformis stretch 2 x 30 sec each  Lower Trunk Rotation x 10 each Posterior Pelvic Tilts x 10  Supine marches x 10 each LE Pallof Press with long band 2 x 10 each direction Hinging with one foot on wall 2 x 10  Unilateral Farmer's Carry 7# x 4 Standing March 2 sec hold with unilateral DB hold x 10 each leg Standing Chops with 4# DB x 10 each side   03/24/2023 NuStep Level 4 6 Minutes- PT present to discuss progress Reviewed HEP and updated to add core exercises Posterior Pelvic Tilts x 10  Supine marches x 10 each LE Alt hand and knee press x 10 each Trigger Point Dry-Needling Performed  by Jitka Helmus Treatment instructions: Expect mild to moderate muscle soreness. S/S of pneumothorax if dry needled over a lung field, and to seek immediate medical attention should they occur. Patient verbalized understanding of these instructions and education.  Patient Consent Given: Yes Education handout provided: Previously provided Muscles treated: Rt piriformis; Rt & lumbar multifids  Electrical stimulation performed: No Parameters: N/A Treatment response/outcome: Palpable muscle twitches were felt Manual therapy: STM to lumbar region and Rt piriformis    PATIENT EDUCATION:  Education details: relevant anatomy, exercises, dry needling Person educated: Patient Education method: Explanation and Handouts Education comprehension: verbalized understanding  HOME EXERCISE PROGRAM: Access Code: [email or web address] URL: [email or web address] Date: 03/24/2023 Prepared by: Claude Manges  Exercises - Beginner Bridge  - 1 x daily - 7 x weekly - 2 sets - 10 reps - Hip Flexion Stretch  - 1 x daily - 7 x weekly - 2 sets - 8 reps - 5 hold - Seated Piriformis Stretch with Trunk Bend  - 1 x daily - 7 x weekly - 2 sets - 20-30 hold - Supine Posterior Pelvic Tilt  - 1 x daily - 7 x weekly - 2 sets - 10 reps  ASSESSMENT:  CLINICAL IMPRESSION: Today's treatment session focused on hip mobility and core strengthening. Patient was a little bit more painful today since she had an early morning appointment. Her back pain is normally the worse in the morning. TA contraction with leg movements were more challenging for patient. Required verbal cues to maintain engagement. Incorporated more standing core exercises this treatment session. Patient required verbal and visual cues for correct performance of exercises. Patient will benefit from skilled PT to address the below impairments and improve overall function.   OBJECTIVE IMPAIRMENTS: decreased mobility, decreased ROM, decreased strength, increased  muscle spasms, impaired flexibility, and pain.   ACTIVITY LIMITATIONS: carrying, bending, standing, and caring for others  PARTICIPATION LIMITATIONS: occupation  PERSONAL FACTORS: Fitness and Time since onset of injury/illness/exacerbation are also affecting patient's functional outcome.   REHAB POTENTIAL: Good  CLINICAL DECISION MAKING: Stable/uncomplicated  EVALUATION COMPLEXITY: Low   GOALS: Goals reviewed with patient? Yes  SHORT TERM GOALS: Target date: 04/14/2023    Pt will be independent with HEP.   Baseline: Goal status: INITIAL  2.  Pt will report max 3/10 pain Baseline:  Goal status: INITIAL  3.  Pt will be able to stand and cook for at least 30 mins without increased pain Baseline:  Goal status: INITIAL    LONG TERM GOALS: Target date: 06/09/2023    Pt will be able to lie on the floor and get up without LBP  Baseline:  Goal status: INITIAL  2.  Pt will have decreased Oswestry by at least 20% Baseline: 36% Goal status: INITIAL  3.  Pt will improve bilat hip strength and core strength by 1 grade at least to reduce LBP Baseline:  Goal status: INITIAL   PLAN:  PT FREQUENCY: 1-2x/week  PT DURATION: 12 weeks  PLANNED INTERVENTIONS: Therapeutic exercises, Therapeutic activity, Neuromuscular re-education, Balance training, Gait training, Patient/Family education, Self Care, Joint mobilization, Aquatic Therapy, Dry Needling, Electrical stimulation, Spinal manipulation, Spinal mobilization, and Manual therapy  PLAN FOR NEXT SESSION: assess response to treatment session; continue core & hip strengthening    Claude Manges, PT 03/29/23 9:29 AM

## 2023-03-31 ENCOUNTER — Encounter: Payer: Self-pay | Admitting: Physical Therapy

## 2023-03-31 ENCOUNTER — Ambulatory Visit: Payer: Medicaid Other | Admitting: Physical Therapy

## 2023-03-31 ENCOUNTER — Encounter: Payer: Medicaid Other | Admitting: Physical Therapy

## 2023-03-31 DIAGNOSIS — M62830 Muscle spasm of back: Secondary | ICD-10-CM

## 2023-03-31 DIAGNOSIS — M545 Low back pain, unspecified: Secondary | ICD-10-CM | POA: Diagnosis not present

## 2023-03-31 DIAGNOSIS — M6281 Muscle weakness (generalized): Secondary | ICD-10-CM

## 2023-03-31 NOTE — Therapy (Signed)
OUTPATIENT PHYSICAL THERAPY LUMBAR TREATMENT NOTE   Patient Name: Megan Shannon MRN: [id number] DOB:05-12-1996, 27 y.o., female Today's Date: 03/31/2023  END OF SESSION:  PT End of Session - 03/31/23 1013     Visit Number 4    Number of Visits 8    Authorization Type Medicaid [employer]    Authorization - Visit Number 3    PT Start Time 0931    PT Stop Time 1012    PT Time Calculation (min) 41 min    Activity Tolerance Patient tolerated treatment well    Behavior During Therapy [facility] for tasks assessed/performed                Past Medical History:  Diagnosis Date   Atypical chest pain    Cholestasis during pregnancy in third trimester 12/11/2021   Diabetes mellitus without complication (HCC)    Gestational diabetes    History of gestational diabetes 11/12/2021   Seasonal allergies    Past Surgical History:  Procedure Laterality Date   WISDOM TOOTH EXTRACTION     Patient Active Problem List   Diagnosis Date Noted   Impaired fasting glucose 03/06/2022   IUD (intrauterine device) in place 01/04/2022   Gestational thrombocytopenia (HCC) 12/14/2021   History of cholestasis during pregnancy 12/11/2021   History of gestational diabetes 11/12/2021   Prediabetes 08/03/2021    PCP: Irven Coe, MD PCP - General  REFERRING PROVIDER: Estill Bamberg, MD Ref Provider  REFERRING DIAG: M54.50 (ICD-10-CM) - Low back pain M53.3 (ICD-10-CM) - SI (sacroiliac) joint dysfunction  THERAPY DIAG:  Muscle weakness (generalized)  Muscle spasm of back  Rationale for Evaluation and Treatment: Rehabilitation  ONSET DATE: 2017  SUBJECTIVE:                                                                                                                                                                                           SUBJECTIVE STATEMENT: Patient reports she is doing well today. She is not currently having any pain. She felt sore after last treatment session but  it went away by the afternoon.   From Eval: Pt reports that she had a work injury 5-7 years ago ( pt cannot quite remember)  she was lifting a patient at work, she hurt her back, did PT, got better- about 80%. She recently gave birth to twins, it got worse. She was sent here for low back and pelvic PT, she is not sure why. Her main complaint is her low back, her pain is not 24/7, sometimes her back gives out and she has to hold onto things.  She has had 4 vaginal  deliveries. Kids ages 29,4 and 51.  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   Fluid intake: Yes:      PAIN:  Across low back, more on right PRECAUTIONS: None  RED FLAGS: None   WEIGHT BEARING RESTRICTIONS: No  FALLS:  Has patient fallen in last 6 months? No  LIVING ENVIRONMENT: Lives with: lives with their family Lives in: House/apartment Stairs: No Has following equipment at home: None  OCCUPATION: stay at home mom  PLOF: Independent  PATIENT GOALS: to no have low back pain  PERTINENT HISTORY:   Sexual abuse: No  BOWEL MOVEMENT: Pain with bowel movement: No Type of bowel movement:Type (Bristol Stool Scale) 3-4 Fully empty rectum: Yes:   Leakage: No Pads: No Fiber supplement: No  URINATION: Pain with urination: No Fully empty bladder: Yes:   Stream: Strong Urgency: No Frequency: wfl Leakage:  no Pads: No  INTERCOURSE: Pain with intercourse:  no Ability to have vaginal penetration:  Yes:       PREGNANCY: Vaginal deliveries 4 Tearing No C-section deliveries 0 Currently pregnant No   OBJECTIVE:  Note: Objective measures were completed at Evaluation unless otherwise noted.  DIAGNOSTIC FINDINGS:  Tight and tender TP right left paraspinals  PATIENT SURVEYS:  18 / 50 = 36.0 % Oswestry  COGNITION: Overall cognitive status: Within functional limits for tasks assessed     SENSATION: Light touch: Appears intact Proprioception: Appears intact  LUMBAR SPECIAL TESTS:  Straight leg raise test:  Negative, weak and guarded   POSTURE: decreased lumbar lordosis and posterior pelvic tilt  PELVIC ALIGNMENT:  LUMBARAROM/PROM:  A/PROM A/PROM  eval  Flexion Guarded movement  Extension Seems WFL  Right lateral flexion Seems WFL  Left lateral flexion Seems WFL  Right rotation   Left rotation    (Blank rows = not tested)  LOWER EXTREMITY ROM:  Passive ROM Right eval Left eval  Hip flexion tight tight  Hip extension    Hip abduction    Hip adduction    Hip internal rotation    Hip external rotation    Knee flexion    Knee extension    Ankle dorsiflexion    Ankle plantarflexion    Ankle inversion    Ankle eversion     (Blank rows = not tested)  LOWER EXTREMITY MMT:  MMT Right eval Left eval  Hip flexion 4-/5 4/5  Hip extension    Hip abduction    Hip adduction    Hip internal rotation    Hip external rotation    Knee flexion    Knee extension 5/5 5/5  Ankle dorsiflexion    Ankle plantarflexion    Ankle inversion    Ankle eversion             TODAY'S TREATMENT:  DATE:   03/31/2023 NuStep Level 5 6 Minutes- PT present to discuss progress Supine Marches + TA contraction  Supine SLR + TA contraction Pallof Press with long band 2 x 10 each direction Hinging with one foot on wall 2 x 10  Unilateral Farmer's Carry 9# x 4 Standing March 2 sec hold with unilateral DB 9 #  hold 2 x 10 each leg Side steps with blue loop x 4 Monster walks with blue loop x 4 Hip Matrix (abduction and extension) 15# 2 x 10 Squatting technique    03/29/2023 NuStep Level 5 6 Minutes- PT present to discuss progress 3 way SB ball stretch x 8 each Hamstring stretch 2 x 30 sec each Seated piriformis stretch 2 x 30 sec each  Lower Trunk Rotation x 10 each Posterior Pelvic Tilts x 10  Supine marches x 10 each LE Pallof Press with long band 2 x 10 each  direction Hinging with one foot on wall 2 x 10  Unilateral Farmer's Carry 7# x 4 Standing March 2 sec hold with unilateral DB hold x 10 each leg Standing Chops with 4# DB x 10 each side   03/24/2023 NuStep Level 4 6 Minutes- PT present to discuss progress Reviewed HEP and updated to add core exercises Posterior Pelvic Tilts x 10  Supine marches x 10 each LE Alt hand and knee press x 10 each Trigger Point Dry-Needling Performed by Jitka Helmus Treatment instructions: Expect mild to moderate muscle soreness. S/S of pneumothorax if dry needled over a lung field, and to seek immediate medical attention should they occur. Patient verbalized understanding of these instructions and education.  Patient Consent Given: Yes Education handout provided: Previously provided Muscles treated: Rt piriformis; Rt & lumbar multifids  Electrical stimulation performed: No Parameters: N/A Treatment response/outcome: Palpable muscle twitches were felt Manual therapy: STM to lumbar region and Rt piriformis    PATIENT EDUCATION:  Education details: relevant anatomy, exercises, dry needling Person educated: Patient Education method: Explanation and Handouts Education comprehension: verbalized understanding  HOME EXERCISE PROGRAM: Access Code: [email or web address] URL: [email or web address] Date: 03/24/2023 Prepared by: Claude Manges  Exercises - Beginner Bridge  - 1 x daily - 7 x weekly - 2 sets - 10 reps - Hip Flexion Stretch  - 1 x daily - 7 x weekly - 2 sets - 8 reps - 5 hold - Seated Piriformis Stretch with Trunk Bend  - 1 x daily - 7 x weekly - 2 sets - 20-30 hold - Supine Posterior Pelvic Tilt  - 1 x daily - 7 x weekly - 2 sets - 10 reps  ASSESSMENT:  CLINICAL IMPRESSION: Today's treatment session focused on core and hip strengthening. Progressed patient's core exercises to include movement of LE's. Patient verbalized that SLR + TA contraction was more challenging. Patient tolerated  treatment session well. Required verbal and visual cues for proper performance of monster walks. Hip matrix was a good challenge for patient's hip stability and strengthen. Assessed patient's squatting technique and she required max verbal and visual cues for proper form. Patient experienced increased back pain when descending to squat. Patient will benefit from skilled PT to address the below impairments and improve overall function.   OBJECTIVE IMPAIRMENTS: decreased mobility, decreased ROM, decreased strength, increased muscle spasms, impaired flexibility, and pain.   ACTIVITY LIMITATIONS: carrying, bending, standing, and caring for others  PARTICIPATION LIMITATIONS: occupation  PERSONAL FACTORS: Fitness and Time since onset of injury/illness/exacerbation are also affecting patient's functional outcome.   REHAB POTENTIAL: Good  CLINICAL DECISION MAKING: Stable/uncomplicated  EVALUATION COMPLEXITY: Low   GOALS: Goals reviewed with patient? Yes  SHORT TERM GOALS: Target date: 04/14/2023    Pt will be independent with HEP.   Baseline: Goal status: INITIAL  2.  Pt will report max 3/10 pain Baseline:  Goal status: INITIAL  3.  Pt will be able to stand and cook for at least 30 mins without increased pain Baseline:  Goal status: INITIAL    LONG TERM GOALS: Target date: 06/09/2023    Pt will be able to lie on the floor and get up without LBP Baseline:  Goal status: INITIAL  2.  Pt will have decreased Oswestry by at least 20% Baseline: 36% Goal status: INITIAL  3.  Pt will improve bilat hip strength and core strength by 1 grade at least to reduce LBP Baseline:  Goal status: INITIAL   PLAN:  PT FREQUENCY: 1-2x/week  PT DURATION: 12 weeks  PLANNED INTERVENTIONS: Therapeutic exercises, Therapeutic activity, Neuromuscular re-education, Balance training, Gait training, Patient/Family education, Self Care, Joint mobilization, Aquatic Therapy, Dry Needling, Electrical  stimulation, Spinal manipulation, Spinal mobilization, and Manual therapy  PLAN FOR NEXT SESSION: progress core exercises as tolerated (tall kneeling; bird dogs); continue working on squatting form   Claude Manges, PT 03/31/23 10:14 AM

## 2023-04-06 ENCOUNTER — Ambulatory Visit: Payer: Medicaid Other | Admitting: Physical Therapy

## 2023-04-06 ENCOUNTER — Encounter: Payer: Self-pay | Admitting: Physical Therapy

## 2023-04-06 DIAGNOSIS — M545 Low back pain, unspecified: Secondary | ICD-10-CM | POA: Diagnosis not present

## 2023-04-06 DIAGNOSIS — M6281 Muscle weakness (generalized): Secondary | ICD-10-CM

## 2023-04-06 DIAGNOSIS — M62830 Muscle spasm of back: Secondary | ICD-10-CM

## 2023-04-06 NOTE — Therapy (Signed)
OUTPATIENT PHYSICAL THERAPY LUMBAR TREATMENT NOTE   Patient Name: Megan Shannon MRN: [id number] DOB:1996-04-20, 27 y.o., female Today's Date: 04/06/2023  END OF SESSION:  PT End of Session - 04/06/23 1700     Visit Number 5    Authorization Type Medicaid [employer]    Authorization Time Period 03/17/2023-05/15/2023    Authorization - Visit Number 4    Authorization - Number of Visits 7    PT Start Time 1617    PT Stop Time 1656    PT Time Calculation (min) 39 min    Activity Tolerance Patient tolerated treatment well    Behavior During Therapy [facility] for tasks assessed/performed                 Past Medical History:  Diagnosis Date   Atypical chest pain    Cholestasis during pregnancy in third trimester 12/11/2021   Diabetes mellitus without complication (HCC)    Gestational diabetes    History of gestational diabetes 11/12/2021   Seasonal allergies    Past Surgical History:  Procedure Laterality Date   WISDOM TOOTH EXTRACTION     Patient Active Problem List   Diagnosis Date Noted   Impaired fasting glucose 03/06/2022   IUD (intrauterine device) in place 01/04/2022   Gestational thrombocytopenia (HCC) 12/14/2021   History of cholestasis during pregnancy 12/11/2021   History of gestational diabetes 11/12/2021   Prediabetes 08/03/2021    PCP: Irven Coe, MD PCP - General  REFERRING PROVIDER: Estill Bamberg, MD Ref Provider  REFERRING DIAG: M54.50 (ICD-10-CM) - Low back pain M53.3 (ICD-10-CM) - SI (sacroiliac) joint dysfunction  THERAPY DIAG:  Muscle weakness (generalized)  Muscle spasm of back  Rationale for Evaluation and Treatment: Rehabilitation  ONSET DATE: 2017  SUBJECTIVE:                                                                                                                                                                                           SUBJECTIVE STATEMENT: Patient reports she is doing good today. She is not  currently having any back pain but she was more painful this morning. 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  From Eval: Pt reports that she had a work injury 5-7 years ago ( pt cannot quite remember)  she was lifting a patient at work, she hurt her back, did PT, got better- about 80%. She recently gave birth to twins, it got worse. She was sent here for low back and pelvic PT, she is not sure why. Her main complaint is her low back, her pain is not 24/7, sometimes her back gives out  and she has to hold onto things.  She has had 4 vaginal deliveries. Kids ages 25,4 and 22.  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   Fluid intake: Yes:      PAIN:  Across low back, more on right PRECAUTIONS: None  RED FLAGS: None   WEIGHT BEARING RESTRICTIONS: No  FALLS:  Has patient fallen in last 6 months? No  LIVING ENVIRONMENT: Lives with: lives with their family Lives in: House/apartment Stairs: No Has following equipment at home: None  OCCUPATION: stay at home mom  PLOF: Independent  PATIENT GOALS: to no have low back pain  PERTINENT HISTORY:   Sexual abuse: No  BOWEL MOVEMENT: Pain with bowel movement: No Type of bowel movement:Type (Bristol Stool Scale) 3-4 Fully empty rectum: Yes:   Leakage: No Pads: No Fiber supplement: No  URINATION: Pain with urination: No Fully empty bladder: Yes:   Stream: Strong Urgency: No Frequency: wfl Leakage:  no Pads: No  INTERCOURSE: Pain with intercourse:  no Ability to have vaginal penetration:  Yes:       PREGNANCY: Vaginal deliveries 4 Tearing No C-section deliveries 0 Currently pregnant No   OBJECTIVE:  Note: Objective measures were completed at Evaluation unless otherwise noted.  DIAGNOSTIC FINDINGS:  Tight and tender TP right left paraspinals  PATIENT SURVEYS:  18 / 50 = 36.0 % Oswestry  COGNITION: Overall cognitive status: Within functional limits for tasks assessed     SENSATION: Light touch: Appears  intact Proprioception: Appears intact  LUMBAR SPECIAL TESTS:  Straight leg raise test: Negative, weak and guarded   POSTURE: decreased lumbar lordosis and posterior pelvic tilt  PELVIC ALIGNMENT:  LUMBARAROM/PROM:  A/PROM A/PROM  eval  Flexion Guarded movement  Extension Seems WFL  Right lateral flexion Seems WFL  Left lateral flexion Seems WFL  Right rotation   Left rotation    (Blank rows = not tested)  LOWER EXTREMITY ROM:  Passive ROM Right eval Left eval  Hip flexion tight tight  Hip extension    Hip abduction    Hip adduction    Hip internal rotation    Hip external rotation    Knee flexion    Knee extension    Ankle dorsiflexion    Ankle plantarflexion    Ankle inversion    Ankle eversion     (Blank rows = not tested)  LOWER EXTREMITY MMT:  MMT Right eval Left eval  Hip flexion 4-/5 4/5  Hip extension    Hip abduction    Hip adduction    Hip internal rotation    Hip external rotation    Knee flexion    Knee extension 5/5 5/5  Ankle dorsiflexion    Ankle plantarflexion    Ankle inversion    Ankle eversion             TODAY'S TREATMENT:  DATE:   04/06/2023 NuStep Level 4 6 Minutes- PT present to discuss progress Pallof Press with long band 2 x 10 each direction Standing hinging against wall x 10 Golfer's lift x 2 Bilateral Farmer's Carry 9# x 3 laps around gym Standing March 2 sec hold with unilateral DB 9 #  hold 2 x 10 each leg Leg press 55# 2 x 10 ; unilateral 30# 2x10 Hip Matrix (abduction and extension) 25# 2 x 10 Squatting technique at barre squat as tactile cue x 10 then no chair x 10 Standing chops at cable column 5# x 10 each direction Modified bird dog legs only x 10 each   03/31/2023 NuStep Level 5 6 Minutes- PT present to discuss progress Supine Marches + TA contraction  Supine SLR + TA  contraction Pallof Press with long band 2 x 10 each direction Hinging with one foot on wall 2 x 10  Unilateral Farmer's Carry 9# x 4 Standing March 2 sec hold with unilateral DB 9 #  hold 2 x 10 each leg Side steps with blue loop x 4 Monster walks with blue loop x 4 Hip Matrix (abduction and extension) 15# 2 x 10 Squatting technique    03/29/2023 NuStep Level 5 6 Minutes- PT present to discuss progress 3 way SB ball stretch x 8 each Hamstring stretch 2 x 30 sec each Seated piriformis stretch 2 x 30 sec each  Lower Trunk Rotation x 10 each Posterior Pelvic Tilts x 10  Supine marches x 10 each LE Pallof Press with long band 2 x 10 each direction Hinging with one foot on wall 2 x 10  Unilateral Farmer's Carry 7# x 4 Standing March 2 sec hold with unilateral DB hold x 10 each leg Standing Chops with 4# DB x 10 each side    PATIENT EDUCATION:  Education details: relevant anatomy, exercises, dry needling Person educated: Patient Education method: Explanation and Handouts Education comprehension: verbalized understanding  HOME EXERCISE PROGRAM: Access Code: [email or web address] URL: [email or web address] Date: 03/24/2023 Prepared by: Claude Manges  Exercises - Beginner Bridge  - 1 x daily - 7 x weekly - 2 sets - 10 reps - Hip Flexion Stretch  - 1 x daily - 7 x weekly - 2 sets - 8 reps - 5 hold - Seated Piriformis Stretch with Trunk Bend  - 1 x daily - 7 x weekly - 2 sets - 20-30 hold - Supine Posterior Pelvic Tilt  - 1 x daily - 7 x weekly - 2 sets - 10 reps  ASSESSMENT:  CLINICAL IMPRESSION: Today's treatment session focused on core and hip strengthening. Patient's back was not painful at all today. Reviewed patient's squatting technique and she required verbal and visual cues for proper form. Noted improved performance compared to last treatment session. Educated patient on proper hinging and golfers technique when lifting objects. 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 Progressed patient's hip and core exercises and she tolerated progressions well. Patient will benefit from skilled PT to address the below impairments and improve overall function.    OBJECTIVE IMPAIRMENTS: decreased mobility, decreased ROM, decreased strength, increased muscle spasms, impaired flexibility, and pain.   ACTIVITY LIMITATIONS: carrying, bending, standing, and caring for others  PARTICIPATION LIMITATIONS: occupation  PERSONAL FACTORS: Fitness and Time since onset of injury/illness/exacerbation are also affecting patient's functional outcome.   REHAB POTENTIAL: Good  CLINICAL DECISION MAKING: Stable/uncomplicated  EVALUATION COMPLEXITY: Low   GOALS: Goals reviewed with patient? Yes  SHORT TERM  GOALS: Target date: 04/14/2023    Pt will be independent with HEP.   Baseline: Goal status: INITIAL  2.  Pt will report max 3/10 pain Baseline:  Goal status: INITIAL  3.  Pt will be able to stand and cook for at least 30 mins without increased pain Baseline:  Goal status: INITIAL    LONG TERM GOALS: Target date: 06/09/2023    Pt will be able to lie on the floor and get up without LBP Baseline:  Goal status: INITIAL  2.  Pt will have decreased Oswestry by at least 20% Baseline: 36% Goal status: INITIAL  3.  Pt will improve bilat hip strength and core strength by 1 grade at least to reduce LBP Baseline:  Goal status: INITIAL   PLAN:  PT FREQUENCY: 1-2x/week  PT DURATION: 12 weeks  PLANNED INTERVENTIONS: Therapeutic exercises, Therapeutic activity, Neuromuscular re-education, Balance training, Gait training, Patient/Family education, Self Care, Joint mobilization, Aquatic Therapy, Dry Needling, Electrical stimulation, Spinal manipulation, Spinal mobilization, and Manual therapy  PLAN FOR NEXT SESSION: continue progressing core exercise  Claude Manges, PT 04/06/23 5:01 PM

## 2023-04-07 ENCOUNTER — Ambulatory Visit: Payer: Medicaid Other

## 2023-04-07 ENCOUNTER — Encounter: Payer: Medicaid Other | Admitting: Physical Therapy

## 2023-04-07 DIAGNOSIS — M6281 Muscle weakness (generalized): Secondary | ICD-10-CM

## 2023-04-07 DIAGNOSIS — M545 Low back pain, unspecified: Secondary | ICD-10-CM | POA: Diagnosis not present

## 2023-04-07 DIAGNOSIS — R252 Cramp and spasm: Secondary | ICD-10-CM

## 2023-04-07 DIAGNOSIS — M62830 Muscle spasm of back: Secondary | ICD-10-CM

## 2023-04-07 DIAGNOSIS — R293 Abnormal posture: Secondary | ICD-10-CM

## 2023-04-07 DIAGNOSIS — R262 Difficulty in walking, not elsewhere classified: Secondary | ICD-10-CM

## 2023-04-07 NOTE — Therapy (Signed)
OUTPATIENT PHYSICAL THERAPY LUMBAR TREATMENT NOTE   Patient Name: Megan Shannon MRN: [id number] DOB:1995-09-19, 27 y.o., female Today's Date: 04/07/2023  END OF SESSION:  PT End of Session - 04/07/23 1611     Visit Number 6    Number of Visits 8    Date for PT Re-Evaluation 05/15/23    Authorization Type Medicaid Healthy blue    Authorization Time Period 03/17/2023-05/15/2023    Authorization - Visit Number 6    Authorization - Number of Visits 7    Progress Note Due on Visit 7    PT Start Time 1615    PT Stop Time 1705    PT Time Calculation (min) 50 min    Activity Tolerance Patient tolerated treatment well    Behavior During Therapy [facility] for tasks assessed/performed                 Past Medical History:  Diagnosis Date   Atypical chest pain    Cholestasis during pregnancy in third trimester 12/11/2021   Diabetes mellitus without complication (HCC)    Gestational diabetes    History of gestational diabetes 11/12/2021   Seasonal allergies    Past Surgical History:  Procedure Laterality Date   WISDOM TOOTH EXTRACTION     Patient Active Problem List   Diagnosis Date Noted   Impaired fasting glucose 03/06/2022   IUD (intrauterine device) in place 01/04/2022   Gestational thrombocytopenia (HCC) 12/14/2021   History of cholestasis during pregnancy 12/11/2021   History of gestational diabetes 11/12/2021   Prediabetes 08/03/2021    PCP: Irven Coe, MD PCP - General  REFERRING PROVIDER: Estill Bamberg, MD Ref Provider  REFERRING DIAG: M54.50 (ICD-10-CM) - Low back pain M53.3 (ICD-10-CM) - SI (sacroiliac) joint dysfunction  THERAPY DIAG:  Muscle weakness (generalized)  Muscle spasm of back  Cramp and spasm  Difficulty in walking, not elsewhere classified  Abnormal posture  Rationale for Evaluation and Treatment: Rehabilitation  ONSET DATE: 2017  SUBJECTIVE:                                                                                                                                                                                            SUBJECTIVE STATEMENT: Patient reports she is doing ok but still having some mild pain.  Pain level 2/10.  It's worse in the morning.    From Eval: Pt reports that she had a work injury 5-7 years ago ( pt cannot quite remember)  she was lifting a patient at work, she hurt her back, did PT, got better- about 80%. She recently gave birth to twins, it got worse. She was  sent here for low back and pelvic PT, she is not sure why. Her main complaint is her low back, her pain is not 24/7, sometimes her back gives out and she has to hold onto things.  She has had 4 vaginal deliveries. Kids ages 1 (twins), 4 and 7.  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   Fluid intake: Yes:      PAIN:  Across low back, more on right PRECAUTIONS: None  RED FLAGS: None   WEIGHT BEARING RESTRICTIONS: No  FALLS:  Has patient fallen in last 6 months? No  LIVING ENVIRONMENT: Lives with: lives with their family Lives in: House/apartment Stairs: No Has following equipment at home: None  OCCUPATION: stay at home mom  PLOF: Independent  PATIENT GOALS: to no have low back pain  PERTINENT HISTORY:   Sexual abuse: No  BOWEL MOVEMENT: Pain with bowel movement: No Type of bowel movement:Type (Bristol Stool Scale) 3-4 Fully empty rectum: Yes:   Leakage: No Pads: No Fiber supplement: No  URINATION: Pain with urination: No Fully empty bladder: Yes:   Stream: Strong Urgency: No Frequency: wfl Leakage:  no Pads: No  INTERCOURSE: Pain with intercourse:  no Ability to have vaginal penetration:  Yes:     PREGNANCY: Vaginal deliveries 4 Tearing No C-section deliveries 0 Currently pregnant No   OBJECTIVE:  Note: Objective measures were completed at Evaluation unless otherwise noted.  DIAGNOSTIC FINDINGS:  Tight and tender TP right left paraspinals  PATIENT SURVEYS:  18 / 50 = 36.0 %  Oswestry  COGNITION: Overall cognitive status: Within functional limits for tasks assessed     SENSATION: Light touch: Appears intact Proprioception: Appears intact  LUMBAR SPECIAL TESTS:  Straight leg raise test: Negative, weak and guarded   POSTURE: decreased lumbar lordosis and posterior pelvic tilt  PELVIC ALIGNMENT:  LUMBARAROM/PROM:  A/PROM A/PROM  eval  Flexion Guarded movement  Extension Seems WFL  Right lateral flexion Seems WFL  Left lateral flexion Seems WFL  Right rotation   Left rotation    (Blank rows = not tested)  LOWER EXTREMITY ROM:  Passive ROM Right eval Left eval  Hip flexion tight tight  Hip extension    Hip abduction    Hip adduction    Hip internal rotation    Hip external rotation    Knee flexion    Knee extension    Ankle dorsiflexion    Ankle plantarflexion    Ankle inversion    Ankle eversion     (Blank rows = not tested)  LOWER EXTREMITY MMT:  MMT Right eval Left eval  Hip flexion 4-/5 4/5  Hip extension    Hip abduction    Hip adduction    Hip internal rotation    Hip external rotation    Knee flexion    Knee extension 5/5 5/5  Ankle dorsiflexion    Ankle plantarflexion    Ankle inversion    Ankle eversion             TODAY'S TREATMENT:  DATE:   04/06/2023 NuStep Level 5 x 5 Minutes- PT present to discuss progress Standing hamstring stretch 3 x 30 sec Standing hip flexor quad stretch 3 x 30 sec Standing trunk rotation with red band with handles x 10 each side Pallof Press with long band 2 x 10 each direction Standing hinging against wall 2 x 10 (patient requires heavy vc's) Supine PPT x 20 PPT with 90/90 heel taps PPT with dying bug Star Crunch x 10 each side PPT with ball pass (red physio ball) x 20 Seated physio ball roll outs with blue physio ball x 5 each direction holding 5 sec  each  Ice to lumbar spine in hook lying with wedge under knees x 10 min at end of session  04/06/2023 NuStep Level 4 6 Minutes- PT present to discuss progress Pallof Press with long band 2 x 10 each direction Standing hinging against wall x 10 Golfer's lift x 2 Bilateral Farmer's Carry 9# x 3 laps around gym Standing March 2 sec hold with unilateral DB 9 #  hold 2 x 10 each leg Leg press 55# 2 x 10 ; unilateral 30# 2x10 Hip Matrix (abduction and extension) 25# 2 x 10 Squatting technique at barre squat as tactile cue x 10 then no chair x 10 Standing chops at cable column 5# x 10 each direction Modified bird dog legs only x 10 each   03/31/2023 NuStep Level 5 6 Minutes- PT present to discuss progress Supine Marches + TA contraction  Supine SLR + TA contraction Pallof Press with long band 2 x 10 each direction Hinging with one foot on wall 2 x 10  Unilateral Farmer's Carry 9# x 4 Standing March 2 sec hold with unilateral DB 9 #  hold 2 x 10 each leg Side steps with blue loop x 4 Monster walks with blue loop x 4 Hip Matrix (abduction and extension) 15# 2 x 10 Squatting technique    03/29/2023 NuStep Level 5 6 Minutes- PT present to discuss progress 3 way SB ball stretch x 8 each Hamstring stretch 2 x 30 sec each Seated piriformis stretch 2 x 30 sec each  Lower Trunk Rotation x 10 each Posterior Pelvic Tilts x 10  Supine marches x 10 each LE Pallof Press with long band 2 x 10 each direction Hinging with one foot on wall 2 x 10  Unilateral Farmer's Carry 7# x 4 Standing March 2 sec hold with unilateral DB hold x 10 each leg Standing Chops with 4# DB x 10 each side    PATIENT EDUCATION:  Education details: relevant anatomy, exercises, dry needling Person educated: Patient Education method: Explanation and Handouts Education comprehension: verbalized understanding  HOME EXERCISE PROGRAM: Access Code: [email or web address] URL: [email or web address] Date:  03/24/2023 Prepared by: Claude Manges  Exercises - Beginner Bridge  - 1 x daily - 7 x weekly - 2 sets - 10 reps - Hip Flexion Stretch  - 1 x daily - 7 x weekly - 2 sets - 8 reps - 5 hold - Seated Piriformis Stretch with Trunk Bend  - 1 x daily - 7 x weekly - 2 sets - 20-30 hold - Supine Posterior Pelvic Tilt  - 1 x daily - 7 x weekly - 2 sets - 10 reps  ASSESSMENT:  CLINICAL IMPRESSION: Megan Shannon is progressing appropriately.  She will be returning to work on Monday.  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  We reviewed need to stay mobile and possibly walk and stretch  during her lunch hour.  She tolerated the supine exercises today with minimal increase in pain.  She demonstrates fair amount of core weakness and needed rest breaks to re-establish pelvic tilt.  She should continue to improve.  Lumbar spine mobility is limited.  She would benefit from isolated lumbar flexion/hip hinging activities.   Patient will benefit from skilled PT to address the below impairments and improve overall function.    OBJECTIVE IMPAIRMENTS: decreased mobility, decreased ROM, decreased strength, increased muscle spasms, impaired flexibility, and pain.   ACTIVITY LIMITATIONS: carrying, bending, standing, and caring for others  PARTICIPATION LIMITATIONS: occupation  PERSONAL FACTORS: Fitness and Time since onset of injury/illness/exacerbation are also affecting patient's functional outcome.   REHAB POTENTIAL: Good  CLINICAL DECISION MAKING: Stable/uncomplicated  EVALUATION COMPLEXITY: Low   GOALS: Goals reviewed with patient? Yes  SHORT TERM GOALS: Target date: 04/14/2023    Pt will be independent with HEP.   Baseline: Goal status: INITIAL  2.  Pt will report max 3/10 pain Baseline:  Goal status: INITIAL  3.  Pt will be able to stand and cook for at least 30 mins without increased pain Baseline:  Goal status: INITIAL    LONG TERM GOALS: Target date: 06/09/2023    Pt will be able to lie  on the floor and get up without LBP Baseline:  Goal status: INITIAL  2.  Pt will have decreased Oswestry by at least 20% Baseline: 36% Goal status: INITIAL  3.  Pt will improve bilat hip strength and core strength by 1 grade at least to reduce LBP Baseline:  Goal status: INITIAL   PLAN:  PT FREQUENCY: 1-2x/week  PT DURATION: 12 weeks  PLANNED INTERVENTIONS: Therapeutic exercises, Therapeutic activity, Neuromuscular re-education, Balance training, Gait training, Patient/Family education, Self Care, Joint mobilization, Aquatic Therapy, Dry Needling, Electrical stimulation, Spinal manipulation, Spinal mobilization, and Manual therapy  PLAN FOR NEXT SESSION: Emphasis on body mechanics and proper lifting using hip hinge, continue progressing core exercise  Preslei Blakley B. Tiyah Zelenak, PT 04/07/23 5:18 PM [facility] [address], [address] [place], [place] [address] Phone # [phone] Fax [phone]

## 2023-04-12 ENCOUNTER — Ambulatory Visit: Payer: Medicaid Other

## 2023-04-12 DIAGNOSIS — R252 Cramp and spasm: Secondary | ICD-10-CM

## 2023-04-12 DIAGNOSIS — M6281 Muscle weakness (generalized): Secondary | ICD-10-CM

## 2023-04-12 DIAGNOSIS — R293 Abnormal posture: Secondary | ICD-10-CM

## 2023-04-12 DIAGNOSIS — M545 Low back pain, unspecified: Secondary | ICD-10-CM | POA: Diagnosis not present

## 2023-04-12 DIAGNOSIS — M62830 Muscle spasm of back: Secondary | ICD-10-CM

## 2023-04-12 DIAGNOSIS — R262 Difficulty in walking, not elsewhere classified: Secondary | ICD-10-CM

## 2023-04-12 NOTE — Therapy (Addendum)
 OUTPATIENT PHYSICAL THERAPY LUMBAR TREATMENT NOTE PHYSICAL THERAPY DISCHARGE SUMMARY  Visits from Start of Care: 7  Current functional level related to goals / functional outcomes: See below   Remaining deficits: See below   Education / Equipment: See below   Patient agrees to discharge. Patient goals were partially met. Patient is being discharged due to not returning since the last visit. Megan Shannon, PT 03/30/24 8:35 AM     Patient Name: Megan Shannon MRN: [id number] DOB:10-19-1995, 27 y.o., female Today's Date: 04/12/2023  END OF SESSION:  PT End of Session - 04/12/23 1624     Visit Number 7    Number of Visits 8    Date for PT Re-Evaluation 05/15/23    Authorization Type Medicaid [employer]    Authorization Time Period 03/17/2023-05/15/2023    Authorization - Visit Number 7    Authorization - Number of Visits 8    Progress Note Due on Visit 7    PT Start Time 1617    PT Stop Time 1700    PT Time Calculation (min) 43 min    Activity Tolerance Patient tolerated treatment well    Behavior During Therapy [facility] for tasks assessed/performed                 Past Medical History:  Diagnosis Date   Atypical chest pain    Cholestasis during pregnancy in third trimester 12/11/2021   Diabetes mellitus without complication (HCC)    Gestational diabetes    History of gestational diabetes 11/12/2021   Seasonal allergies    Past Surgical History:  Procedure Laterality Date   WISDOM TOOTH EXTRACTION     Patient Active Problem List   Diagnosis Date Noted   Impaired fasting glucose 03/06/2022   IUD (intrauterine device) in place 01/04/2022   Gestational thrombocytopenia (HCC) 12/14/2021   History of cholestasis during pregnancy 12/11/2021   History of gestational diabetes 11/12/2021   Prediabetes 08/03/2021    PCP: Leonel Cole, MD PCP - General  REFERRING PROVIDER: Beuford Anes, MD Ref Provider  REFERRING DIAG: M54.50 (ICD-10-CM) - Low  back pain M53.3 (ICD-10-CM) - SI (sacroiliac) joint dysfunction  THERAPY DIAG:  Muscle weakness (generalized)  Muscle spasm of back  Cramp and spasm  Difficulty in walking, not elsewhere classified  Abnormal posture  Rationale for Evaluation and Treatment: Rehabilitation  ONSET DATE: 2017  SUBJECTIVE:                                                                                                                                                                                           SUBJECTIVE STATEMENT: Patient reports she is  sitting a lot for orientation for work.  Pain level 2-3/10.  It's still worse in the morning.    From Eval: Pt reports that she had a work injury 5-7 years ago ( pt cannot quite remember)  she was lifting a patient at work, she hurt her back, did PT, got better- about 80%. She recently gave birth to twins, it got worse. She was sent here for low back and pelvic PT, she is not sure why. Her main complaint is her low back, her pain is not 24/7, sometimes her back gives out and she has to hold onto things.  She has had 4 vaginal deliveries. Kids ages 1 (twins), 4 and 7.  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   Fluid intake: Yes:     PAIN:  Across low back, more on right PRECAUTIONS: None  RED FLAGS: None   WEIGHT BEARING RESTRICTIONS: No  FALLS:  Has patient fallen in last 6 months? No  LIVING ENVIRONMENT: Lives with: lives with their family Lives in: House/apartment Stairs: No Has following equipment at home: None  OCCUPATION: stay at home mom  PLOF: Independent  PATIENT GOALS: to no have low back pain  PERTINENT HISTORY:   Sexual abuse: No  BOWEL MOVEMENT: Pain with bowel movement: No Type of bowel movement:Type (Bristol Stool Scale) 3-4 Fully empty rectum: Yes:   Leakage: No Pads: No Fiber supplement: No  URINATION: Pain with urination: No Fully empty bladder: Yes:   Stream: Strong Urgency: No Frequency: wfl Leakage: no Pads:  No  INTERCOURSE: Pain with intercourse: no Ability to have vaginal penetration:  Yes:     PREGNANCY: Vaginal deliveries 4 Tearing No C-section deliveries 0 Currently pregnant No   OBJECTIVE:  Note: Objective measures were completed at Evaluation unless otherwise noted.  DIAGNOSTIC FINDINGS:  Tight and tender TP right left paraspinals  PATIENT SURVEYS:  18 / 50 = 36.0 % Oswestry  04/12/23: 11/50= 22%  COGNITION: Overall cognitive status: Within functional limits for tasks assessed     SENSATION: Light touch: Appears intact Proprioception: Appears intact  LUMBAR SPECIAL TESTS:  Straight leg raise test: Negative, weak and guarded   POSTURE: decreased lumbar lordosis and posterior pelvic tilt  PELVIC ALIGNMENT:  LUMBARAROM/PROM:  A/PROM A/PROM  eval A/PROM  04/12/23  Flexion Guarded movement Fingertips to knees with slight pain  Extension Seems [facility] WFL  Right lateral flexion Seems [facility] WFL  Left lateral flexion Seems [facility] WFL  Right rotation  [facility]  Left rotation  WFL   (Blank rows = not tested)  LOWER EXTREMITY ROM:  WNL 04/12/23  LOWER EXTREMITY MMT:  MMT Right eval Right  04/12/23 Left eval Left 04/12/23  Hip flexion 4-/5 4-/5 4/5 4/5  Hip extension      Hip abduction      Hip adduction      Hip internal rotation      Hip external rotation      Knee flexion      Knee extension 5/5 5/5 5/5 5/5  Ankle dorsiflexion      Ankle plantarflexion      Ankle inversion      Ankle eversion               TODAY'S TREATMENT:  DATE:   04/12/2023 NuStep Level 5 x 5 Minutes- PT present to discuss progress Standing hamstring stretch 3 x 30 sec Standing hip flexor quad stretch 3 x 30 sec Standing trunk rotation with blue band with handles x 10 each side Pallof Press with blue band with handles  x 10 each direction Supine PPT x  20 PPT with 90/90 heel taps x 20 PPT with dying bug x 20 Hooklying trunk rotation x 20 Star Crunch x 10 each side (needed frequent rest breaks) PPT with ball pass (red physio ball) x 20 (struggled today needing heavy modification) Seated physio ball roll outs with blue physio ball x 10 each direction holding 5 sec each  Ice to lumbar spine in hook lying with wedge under knees x 10 min at end of session  04/07/2023 NuStep Level 5 x 5 Minutes- PT present to discuss progress Standing hamstring stretch 3 x 30 sec Standing hip flexor quad stretch 3 x 30 sec Standing trunk rotation with red band with handles x 10 each side Pallof Press with long band 2 x 10 each direction Standing hinging against wall 2 x 10 (patient requires heavy vc's) Supine PPT x 20 PPT with 90/90 heel taps PPT with dying bug Star Crunch x 10 each side PPT with ball pass (red physio ball) x 20 Seated physio ball roll outs with blue physio ball x 5 each direction holding 5 sec each  Ice to lumbar spine in hook lying with wedge under knees x 10 min at end of session  04/06/2023 NuStep Level 4 6 Minutes- PT present to discuss progress Pallof Press with long band 2 x 10 each direction Standing hinging against wall x 10 Golfer's lift x 2 Bilateral Farmer's Carry 9# x 3 laps around gym Standing March 2 sec hold with unilateral DB 9 #  hold 2 x 10 each leg Leg press 55# 2 x 10 ; unilateral 30# 2x10 Hip Matrix (abduction and extension) 25# 2 x 10 Squatting technique at barre squat as tactile cue x 10 then no chair x 10 Standing chops at cable column 5# x 10 each direction Modified bird dog legs only x 10 each   PATIENT EDUCATION:  Education details: relevant anatomy, exercises, dry needling Person educated: Patient Education method: Explanation and Handouts Education comprehension: verbalized understanding  HOME EXERCISE PROGRAM: Access Code: [email or web address] URL: [email or web address] Date:  03/24/2023 Prepared by: Kristeen Sar  Exercises - Beginner Bridge  - 1 x daily - 7 x weekly - 2 sets - 10 reps - Hip Flexion Stretch  - 1 x daily - 7 x weekly - 2 sets - 8 reps - 5 hold - Seated Piriformis Stretch with Trunk Bend  - 1 x daily - 7 x weekly - 2 sets - 20-30 hold - Supine Posterior Pelvic Tilt  - 1 x daily - 7 x weekly - 2 sets - 10 reps  ASSESSMENT:  CLINICAL IMPRESSION: Megan Shannon struggled today with maintaining pelvic tilt.  She felt it was due to prolonged sitting and being on a computer today.  She was able to complete all tasks but with heavy modifications and resetting her pelvic tilt position.  She is likely not able to do her HEP as consistently due to starting work and helping her kids with homework when she gets home.  She continues to have difficulty with hip hinging and proper bending, stooping lifting due to lumbar mobility issues.   She would benefit from isolated lumbar flexion/hip hinging activities along  with continued core strengthening.   Patient will benefit from skilled PT to address the below impairments and improve overall function.    OBJECTIVE IMPAIRMENTS: decreased mobility, decreased ROM, decreased strength, increased muscle spasms, impaired flexibility, and pain.   ACTIVITY LIMITATIONS: carrying, bending, standing, and caring for others  PARTICIPATION LIMITATIONS: occupation  PERSONAL FACTORS: Fitness and Time since onset of injury/illness/exacerbation are also affecting patient's functional outcome.   REHAB POTENTIAL: Good  CLINICAL DECISION MAKING: Stable/uncomplicated  EVALUATION COMPLEXITY: Low   GOALS: Goals reviewed with patient? Yes  SHORT TERM GOALS: Target date: 04/14/2023    Pt will be independent with HEP.   Baseline: Goal status: MET  2.  Pt will report max 3/10 pain Baseline:  Goal status: MET 04/12/23  3.  Pt will be able to stand and cook for at least 30 mins without increased pain Baseline:  Goal status: In progress  (can tolerate about 20 min)    LONG TERM GOALS: Target date: 06/09/2023    Pt will be able to lie on the floor and get up without LBP Baseline:  Goal status: In progress  2.  Pt will have decreased Oswestry by at least 20% Baseline: 36% Goal status: In progress  3.  Pt will improve bilat hip strength and core strength by 1 grade at least to reduce LBP Baseline:  Goal status: In progress   PLAN:  PT FREQUENCY: 1-2x/week  PT DURATION: 12 weeks  PLANNED INTERVENTIONS: Therapeutic exercises, Therapeutic activity, Neuromuscular re-education, Balance training, Gait training, Patient/Family education, Self Care, Joint mobilization, Aquatic Therapy, Dry Needling, Electrical stimulation, Spinal manipulation, Spinal mobilization, and Manual therapy  PLAN FOR NEXT SESSION: Emphasis on hip hinging, body mechanics and proper lifting using hip hinge, continue progressing core exercise  Megan Shannon, PT 04/12/23 5:15 PM [facility] [address], [address] [place], [place] [address] Phone # [phone] Fax [phone]

## 2023-04-19 ENCOUNTER — Ambulatory Visit: Payer: Medicaid Other

## 2023-04-26 ENCOUNTER — Ambulatory Visit: Payer: Medicaid Other | Admitting: Physical Therapy

## 2023-05-26 ENCOUNTER — Ambulatory Visit: Payer: Medicaid Other

## 2023-06-27 ENCOUNTER — Other Ambulatory Visit: Payer: Self-pay

## 2023-06-27 ENCOUNTER — Ambulatory Visit: Payer: Medicaid Other | Admitting: Obstetrics and Gynecology

## 2023-06-27 ENCOUNTER — Encounter: Payer: Self-pay | Admitting: Obstetrics and Gynecology

## 2023-06-27 ENCOUNTER — Other Ambulatory Visit (HOSPITAL_COMMUNITY)
Admission: RE | Admit: 2023-06-27 | Discharge: 2023-06-27 | Disposition: A | Discharge status: Home / Self Care | Payer: Medicaid Other | Source: Ambulatory Visit | Attending: Obstetrics and Gynecology | Admitting: Obstetrics and Gynecology

## 2023-06-27 VITALS — BP 128/78 | HR 79 | Wt 204.2 lb

## 2023-06-27 DIAGNOSIS — Z113 Encounter for screening for infections with a predominantly sexual mode of transmission: Secondary | ICD-10-CM

## 2023-06-27 DIAGNOSIS — Z975 Presence of (intrauterine) contraceptive device: Secondary | ICD-10-CM

## 2023-06-27 DIAGNOSIS — Z01419 Encounter for gynecological examination (general) (routine) without abnormal findings: Secondary | ICD-10-CM | POA: Diagnosis not present

## 2023-06-27 NOTE — Progress Notes (Signed)
 GYNECOLOGY ANNUAL PREVENTATIVE CARE ENCOUNTER NOTE  History:     Megan Shannon is a 28 y.o. [phone] female here for a routine annual gynecologic exam.  Current complaints: none.   Denies abnormal vaginal bleeding, discharge, pelvic pain, problems with intercourse or other gynecologic concerns.    Gynecologic History Patient's last menstrual period was 05/28/2023 (exact date). Contraception: IUD Last Pap: 07/06/21. Results were: normal with negative HPV Last mammogram: n/a  Obstetric History OB History  Gravida Para Term Preterm AB Living  3 3 3   4   SAB IAB Ectopic Multiple Live Births     1 4    # Outcome Date GA Lbr Len/2nd Weight Sex Type Anes PTL Lv  3A Term 01/04/22 [redacted]w[redacted]d 12:42 6 lb 2.8 oz (2.8 kg) F Vag-Spont EPI  LIV  3B Term 01/04/22 [redacted]w[redacted]d 12:42 6 lb 2.4 oz (2.79 kg) M Vag-Breech EPI  LIV  2 Term 12/14/18 [redacted]w[redacted]d 00:47 / 00:13 8 lb (3.63 kg) M Vag-Spont EPI  LIV     Birth Comments: facial bruising  1 Term 12/26/15 [redacted]w[redacted]d 07:46 / 02:26 10 lb 3.3 oz (4.63 kg) M Vag-Spont EPI  LIV    Past Medical History:  Diagnosis Date   Atypical chest pain    Cholestasis during pregnancy in third trimester 12/11/2021   Diabetes mellitus without complication (HCC)    Gestational diabetes    History of gestational diabetes 11/12/2021   Seasonal allergies     Past Surgical History:  Procedure Laterality Date   WISDOM TOOTH EXTRACTION      Current Outpatient Medications on File Prior to Visit  Medication Sig Dispense Refill   acetaminophen  (TYLENOL ) 500 MG tablet Take 2 tablets (1,000 mg total) by mouth every 8 (eight) hours as needed (pain). 60 tablet 0   ibuprofen  (ADVIL ) 600 MG tablet Take 1 tablet (600 mg total) by mouth every 6 (six) hours as needed (pain). 40 tablet 0   spironolactone  (ALDACTONE ) 100 MG tablet Take 1 tablet (100 mg total) by mouth daily. 30 tablet 2   tretinoin  (RETIN-A ) 0.025 % cream Apply topically 3 (three) times a week. 45 g 2   spironolactone   (ALDACTONE ) 50 MG tablet Take 3 tablets (150 mg total) by mouth daily. 90 tablet 6   No current facility-administered medications on file prior to visit.    No Known Allergies  Social History:  reports that she has never smoked. She has never used smokeless tobacco. She reports that she does not currently use alcohol. She reports that she does not use drugs.  Family History  Problem Relation Age of Onset   Healthy Mother    Thyroid nodules Brother    Diabetes Maternal Grandmother     The following portions of the patient's history were reviewed and updated as appropriate: allergies, current medications, past family history, past medical history, past social history, past surgical history and problem list.  Review of Systems Pertinent items noted in HPI and remainder of comprehensive ROS otherwise negative.  Physical Exam:  BP 128/78   Pulse 79   Wt 204 lb 3.2 oz (92.6 kg)   LMP 05/28/2023 (Exact Date)   Breastfeeding No   BMI 35.05 kg/m  CONSTITUTIONAL: Well-developed, well-nourished female in no acute distress.  HENT:  Normocephalic, atraumatic, External right and left ear normal. Oropharynx is clear and moist EYES: Conjunctivae and EOM are normal.  NECK: Normal range of motion, supple, no masses.  Normal thyroid.  SKIN: Skin is warm and dry. No  rash noted. Not diaphoretic. No erythema. No pallor. MUSCULOSKELETAL: Normal range of motion. No tenderness.  No cyanosis, clubbing, or edema.  2+ distal pulses. NEUROLOGIC: Alert and oriented to person, place, and time. Normal reflexes, muscle tone coordination.  PSYCHIATRIC: Normal mood and affect. Normal behavior. Normal judgment and thought content. CARDIOVASCULAR: Normal heart rate noted, regular rhythm RESPIRATORY: Clear to auscultation bilaterally. Effort and breath sounds normal, no problems with respiration noted. BREASTS: deferred  ABDOMEN: Soft, no distention noted.  No tenderness, rebound or guarding.  PELVIC: Normal  appearing external genitalia and urethral meatus; normal appearing vaginal mucosa and cervix.  No abnormal discharge noted.  IUD strings easily seen.  Vaginal swab taken.  Normal uterine size, no other palpable masses, no uterine or adnexal tenderness.  Performed in the presence of a chaperone.   Assessment and Plan:    1. Women's annual routine gynecological examination (Primary) Normal annual exam  2. Routine screening for STI (sexually transmitted infection) Per pt request - Cervicovaginal ancillary only  3. IUD (intrauterine device) in place IUD strings easily seen  Routine preventative health maintenance measures emphasized. Please refer to After Visit Summary for other counseling recommendations.      Jerilynn Buddle, MD, FACOG Obstetrician & Gynecologist, [employer] for [facility], [facility]

## 2023-06-28 LAB — CERVICOVAGINAL ANCILLARY ONLY
Chlamydia: NEGATIVE
Comment: NEGATIVE
Comment: NEGATIVE
Comment: NORMAL
Neisseria Gonorrhea: NEGATIVE
Trichomonas: NEGATIVE

## 2023-08-11 ENCOUNTER — Encounter: Payer: Self-pay | Admitting: Family Medicine

## 2023-09-07 ENCOUNTER — Other Ambulatory Visit: Payer: Self-pay | Admitting: Family

## 2023-09-07 MED ORDER — METHYLPREDNISOLONE 4 MG PO TBPK: ORAL_TABLET | ORAL | 1 refills | Status: AC

## 2023-09-21 ENCOUNTER — Encounter: Payer: Self-pay | Admitting: Dermatology

## 2023-09-21 ENCOUNTER — Ambulatory Visit: Payer: Medicaid Other | Admitting: Dermatology

## 2023-09-21 VITALS — BP 114/77

## 2023-09-21 DIAGNOSIS — L700 Acne vulgaris: Secondary | ICD-10-CM

## 2023-09-21 DIAGNOSIS — L709 Acne, unspecified: Secondary | ICD-10-CM

## 2023-09-21 DIAGNOSIS — L83 Acanthosis nigricans: Secondary | ICD-10-CM | POA: Diagnosis not present

## 2023-09-21 MED ORDER — TRETINOIN 0.05 % EX CREA
TOPICAL_CREAM | Freq: Every day | CUTANEOUS | 2 refills | Status: DC
Start: 2023-09-21 — End: 2024-03-22

## 2023-09-21 NOTE — Progress Notes (Signed)
   Follow-Up Visit   Subjective  Megan Shannon is a 28 y.o. female who presents for the following: Acne  Patient present today for follow up visit. Patient was last evaluated on 02/21/23. At this visit patient was prescribed Spirolactone 150mg , Tretinoin 0.025% (QOD). Patient reports sxs are better. Patient denies medication changes.  The following portions of the chart were reviewed this encounter and updated as appropriate: medications, allergies, medical history  Review of Systems:  No other skin or systemic complaints except as noted in HPI or Assessment and Plan.  Objective  Well appearing patient in no apparent distress; mood and affect are within normal limits.   A focused examination was performed of the following areas: face   Relevant exam findings are noted in the Assessment and Plan.                 Assessment & Plan   1. Acne - Assessment: Patient using tretinoin one day a week, experiencing burning. Current regimen includes hyaluronic acid serum, cream moisturizers, and spironolactone 150 mg daily. Visible improvement in skin appearance noted, attributed to tretinoin use. Post-inflammatory hyperpigmentation present. - Plan:    Increase tretinoin usage to two nights a week for two weeks, then three nights a week (Monday, Wednesday, Friday)    Continue hyaluronic acid serum and cream moisturizers    Maintain spironolactone at 150 mg daily    Add glycolic or salicylic acid toner on weekends    Prescribe Excedrin Radiant Tone with thiamidol for post-inflammatory hyperpigmentation, to be applied morning and night    Continue La Roche-Posay sunscreen    Increase tretinoin strength (pending insurance coverage)    Follow-up appointment scheduled for end of September  2. Acanthosis nigricans - Assessment: Patient reports lightening of underarm acanthosis nigricans with glycolic acid use every other day, but condition recurs. Patient has not tried combining  treatments due to apprehension. Long-term treatment includes weight loss.  - Plan:    Continue glycolic acid application every other day    Apply Eucerin Radiant Tone to underarms    Consider combining Glyton and Eucerin Radiant Tone    Use hydrocortisone if irritation occurs    Photographs taken to track changes    Encourage diet and exercise for weight management   No follow-ups on file.    Documentation: I have reviewed the above documentation for accuracy and completeness, and I agree with the above.  I, Shirron Louanne Roussel, CMA, am acting as scribe for [employer], DO.    Louana Roup, DO

## 2023-09-21 NOTE — Patient Instructions (Addendum)
 Hello Megan Shannon ,  Thank you for visiting today. Here is a summary of the key instructions:  - Medications:   - Increase tretinoin to 0.05%, start 2 nights a week for 2 weeks (e.g., Monday and Thursday)   - If tolerated, increase to 3 nights a week (e.g., Monday, Wednesday, Friday)   - Continue spironolactone 150 mg daily   - Use Eucerin Radiant Tone on whole face morning and night  - Skincare Routine:   - Use hyaluronic acid serum before moisturizer   - Continue using La Roche-Posay B5 hyaluronic acid serum   - Use gentle face wash   - Apply La Roche-Posay sunscreen daily   - Use glycolic or salicylic acid toner on weekends   - For underarms, continue using glycolic acid every other day   - Try using Glyton and Excedrin Radiant Tone together on underarms  - Instructions:   - Take a break from tretinoin on weekends   - If skin gets irritated, stop using products and apply hydrocortisone   - Continue using Skin Medicinals products as directed   - On nights when using Skin Medicinals, also use Eucerin Radiant Tone  - Follow-up: Next appointment at the end of September  We look forward to seeing you at your next visit. If you have any questions or concerns before then, please do not hesitate to contact our office.  Warm regards,  Dr. [facility]     Important Information  Due to recent changes in healthcare laws, you may see results of your pathology and/or laboratory studies on MyChart before the doctors have had a chance to review them. We understand that in some cases there may be results that are confusing or concerning to you. Please understand that not all results are received at the same time and often the doctors may need to interpret multiple results in order to provide you with the best plan of care or course of treatment. Therefore, we ask that you please give [place] 2 business days to thoroughly review all your results before contacting the office for  clarification. Should we see a critical lab result, you will be contacted sooner.   If You Need Anything After Your Visit  If you have any questions or concerns for your doctor, please call our main line at [phone] If no one answers, please leave a voicemail as directed and we will return your call as soon as possible. Messages left after 4 pm will be answered the following business day.   You may also send [place] a message via MyChart. We typically respond to MyChart messages within 1-2 business days.  For prescription refills, please ask your pharmacy to contact our office. Our fax number is [phone].  If you have an urgent issue when the clinic is closed that cannot wait until the next business day, you can page your doctor at the number below.    Please note that while we do our best to be available for urgent issues outside of office hours, we are not available 24/7.   If you have an urgent issue and are unable to reach [place], you may choose to seek medical care at your doctor's office, retail clinic, urgent care center, or emergency room.  If you have a medical emergency, please immediately call 911 or go to the emergency department. In the event of inclement weather, please call our main line at [phone] for an update on the status of any delays or closures.  Dermatology  Medication Tips: Please keep the boxes that topical medications come in in order to help keep track of the instructions about where and how to use these. Pharmacies typically print the medication instructions only on the boxes and not directly on the medication tubes.   If your medication is too expensive, please contact our office at [phone] or send [place] a message through MyChart.   We are unable to tell what your co-pay for medications will be in advance as this is different depending on your insurance coverage. However, we may be able to find a substitute medication at lower cost or fill out paperwork to  get insurance to cover a needed medication.   If a prior authorization is required to get your medication covered by your insurance company, please allow [place] 1-2 business days to complete this process.  Drug prices often vary depending on where the prescription is filled and some pharmacies may offer cheaper prices.  The website www.goodrx.com contains coupons for medications through different pharmacies. The prices here do not account for what the cost may be with help from insurance (it may be cheaper with your insurance), but the website can give you the price if you did not use any insurance.  - You can print the associated coupon and take it with your prescription to the pharmacy.  - You may also stop by our office during regular business hours and pick up a GoodRx coupon card.  - If you need your prescription sent electronically to a different pharmacy, notify our office through [facility] or by phone at [phone]

## 2024-02-22 ENCOUNTER — Encounter: Payer: Self-pay | Admitting: Dermatology

## 2024-02-22 ENCOUNTER — Ambulatory Visit: Admitting: Dermatology

## 2024-02-22 VITALS — BP 111/71

## 2024-02-22 DIAGNOSIS — L700 Acne vulgaris: Secondary | ICD-10-CM

## 2024-02-22 DIAGNOSIS — L905 Scar conditions and fibrosis of skin: Secondary | ICD-10-CM | POA: Diagnosis not present

## 2024-02-22 DIAGNOSIS — L810 Postinflammatory hyperpigmentation: Secondary | ICD-10-CM

## 2024-02-22 NOTE — Patient Instructions (Addendum)
 Date: Wed Feb 22 2024  Hello [place],  Thank you for visiting today. Here is a summary of the key instructions:  - Medications:   - Continue tretinoin  use to 5 nights a week (decrease as needed)   - Continue taking spironolactone  as prescribed   - Use Avene Cicalfate moisturizer every night after tretinoin  and on nights without tretinoin    - Consider adding over-the-counter azelaic acid in the morning  - Skin Care:   - Use Avene Cicalfate moisturizer to help with skin hydration and calming  - Follow-up:   - Next appointment scheduled for March  - Other Instructions:   - Do not get pregnant while taking spironolactone    - Call for a tretinoin  refill before the March appointment if needed  Please reach out if you have any questions or concerns.  Warm regards,  Dr. [facility]  Important Information  Due to recent changes in healthcare laws, you may see results of your pathology and/or laboratory studies on MyChart before the doctors have had a chance to review them. We understand that in some cases there may be results that are confusing or concerning to you. Please understand that not all results are received at the same time and often the doctors may need to interpret multiple results in order to provide you with the best plan of care or course of treatment. Therefore, we ask that you please give us  2 business days to thoroughly review all your results before contacting the office for clarification. Should we see a critical lab result, you will be contacted sooner.   If You Need Anything After Your Visit  If you have any questions or concerns for your doctor, please call our main line at [phone] If no one answers, please leave a voicemail as directed and we will return your call as soon as possible. Messages left after 4 pm will be answered the following business day.   You may also send us  a message via MyChart. We typically respond to MyChart messages within  1-2 business days.  For prescription refills, please ask your pharmacy to contact our office. Our fax number is [phone].  If you have an urgent issue when the clinic is closed that cannot wait until the next business day, you can page your doctor at the number below.    Please note that while we do our best to be available for urgent issues outside of office hours, we are not available 24/7.   If you have an urgent issue and are unable to reach us , you may choose to seek medical care at your doctor's office, retail clinic, urgent care center, or emergency room.  If you have a medical emergency, please immediately call 911 or go to the emergency department. In the event of inclement weather, please call our main line at [phone] for an update on the status of any delays or closures.  Dermatology Medication Tips: Please keep the boxes that topical medications come in in order to help keep track of the instructions about where and how to use these. Pharmacies typically print the medication instructions only on the boxes and not directly on the medication tubes.   If your medication is too expensive, please contact our office at [phone] or send us  a message through MyChart.   We are unable to tell what your co-pay for medications will be in advance as this is different depending on your insurance coverage. However, we may be able to find a substitute  medication at lower cost or fill out paperwork to get insurance to cover a needed medication.   If a prior authorization is required to get your medication covered by your insurance company, please allow us  1-2 business days to complete this process.  Drug prices often vary depending on where the prescription is filled and some pharmacies may offer cheaper prices.  The website www.goodrx.com contains coupons for medications through different pharmacies. The prices here do not account for what the cost may be with help from insurance (it  may be cheaper with your insurance), but the website can give you the price if you did not use any insurance.  - You can print the associated coupon and take it with your prescription to the pharmacy.  - You may also stop by our office during regular business hours and pick up a GoodRx coupon card.  - If you need your prescription sent electronically to a different pharmacy, notify our office through [facility] or by phone at [phone]

## 2024-02-22 NOTE — Progress Notes (Signed)
   Follow-Up Visit   Subjective  Megan Shannon is a 28 y.o. female established patient who presents for FOLLOW UP on the diagnoses listed below:  Patient was last evaluated on 09/21/23.    Acne: Prescribed Tretinoin  0.05% that she is applying nightly on M-F and she has continued spirolactone 150mg  daily. She is using Eucerin RT serum nightly. She is still using skin medicinals on nights that she is not applying glycolic acid. Patient reports sxs are better.   Are you nursing, pregnant or trying to conceive? No   The following portions of the chart were reviewed this encounter and updated as appropriate: medications, allergies, medical history  Review of Systems:  No other skin or systemic complaints except as noted in HPI or Assessment and Plan.  Objective  Well appearing patient in no apparent distress; mood and affect are within normal limits.   A focused examination was performed of the following areas: face   Relevant exam findings are noted in the Assessment and Plan.          Assessment & Plan   1. Acne Vulgaris with PIH and Mild Acne Scarring - Assessment: Patient has been compliant with tretinoin  treatment, increasing use to 5 nights per week. Well-controlled acne with current regimen of tretinoin  and spironolactone . Given the patient's age (60) and having four children, continued spironolactone  therapy recommended for longer period to prevent acne recurrence. As the weather cools, there may be challenges with skin dryness.  - Plan:    Continue tretinoin  5 nights per week    Provide samples of Avene Cicalfate moisturizer with instructions to apply every night after tretinoin  and on nights without tretinoin  use    Refill spironolactone     Send new prescription for tretinoin     Consider adding over-the-counter azelaic acid in the morning for inflammation, pigmentation, and acne    May consider increasing tretinoin  dosage or switching to a different medication at  next visit    Discuss potential switch to tazarotene next spring for stronger skin remodeling    Contraception counseling provided - patient informed cannot get pregnant while on spironolactone   Follow-up visit scheduled for March to assess response to treatment modifications.   No follow-ups on file.   Documentation: I have reviewed the above documentation for accuracy and completeness, and I agree with the above.  I, Shirron Maranda, CMA, am acting as scribe for [employer], DO.   Delon Lenis, DO

## 2024-03-21 ENCOUNTER — Encounter: Payer: Self-pay | Admitting: Dermatology

## 2024-03-22 ENCOUNTER — Other Ambulatory Visit: Payer: Self-pay

## 2024-03-22 DIAGNOSIS — L700 Acne vulgaris: Secondary | ICD-10-CM

## 2024-03-22 MED ORDER — SPIRONOLACTONE 50 MG PO TABS: 150.0000 mg | ORAL_TABLET | Freq: Every day | ORAL | 6 refills | Status: AC

## 2024-03-22 MED ORDER — TRETINOIN 0.05 % EX CREA: TOPICAL_CREAM | Freq: Every day | CUTANEOUS | 3 refills | Status: AC

## 2024-04-16 ENCOUNTER — Encounter: Payer: Self-pay | Admitting: Radiology

## 2024-08-20 ENCOUNTER — Ambulatory Visit: Admitting: Dermatology
